# Patient Record
Sex: Male | Born: 1980 | Race: White | Hispanic: No | Marital: Married | Smoking: Never smoker
Health system: Southern US, Community
[De-identification: ages and names within clinical notes are randomized; demographics above are authoritative.]

## PROBLEM LIST (undated history)

## (undated) DIAGNOSIS — R7303 Prediabetes: Secondary | ICD-10-CM

## (undated) DIAGNOSIS — K219 Gastro-esophageal reflux disease without esophagitis: Secondary | ICD-10-CM

## (undated) DIAGNOSIS — G510 Bell's palsy: Secondary | ICD-10-CM

## (undated) DIAGNOSIS — J189 Pneumonia, unspecified organism: Secondary | ICD-10-CM

## (undated) DIAGNOSIS — F419 Anxiety disorder, unspecified: Secondary | ICD-10-CM

## (undated) DIAGNOSIS — L0231 Cutaneous abscess of buttock: Secondary | ICD-10-CM

## (undated) DIAGNOSIS — G43009 Migraine without aura, not intractable, without status migrainosus: Secondary | ICD-10-CM

## (undated) HISTORY — DX: Morbid (severe) obesity due to excess calories: E66.01

## (undated) HISTORY — PX: EYE SURGERY: SHX253

## (undated) HISTORY — DX: Gastro-esophageal reflux disease without esophagitis: K21.9

## (undated) HISTORY — DX: Migraine without aura, not intractable, without status migrainosus: G43.009

---

## 2003-01-29 HISTORY — PX: LASIK: SHX215

## 2008-10-29 ENCOUNTER — Emergency Department (HOSPITAL_COMMUNITY): Admission: EM | Admit: 2008-10-29 | Discharge: 2008-10-30 | Payer: Self-pay | Admitting: Emergency Medicine

## 2009-06-12 ENCOUNTER — Ambulatory Visit: Payer: Self-pay | Admitting: Internal Medicine

## 2009-06-12 LAB — CONVERTED CEMR LAB
Basophils Relative: 0.8 % (ref 0.0–3.0)
Chloride: 104 meq/L (ref 96–112)
Creatinine, Ser: 1 mg/dL (ref 0.4–1.5)
HDL: 38.8 mg/dL — ABNORMAL LOW (ref >39.00)
Hemoglobin: 15.3 g/dL (ref 13.0–17.0)
LDL Cholesterol: 116 mg/dL — ABNORMAL HIGH (ref 0–99)
Leukocytes, UA: NEGATIVE
Lymphocytes Relative: 36.3 % (ref 12.0–46.0)
MCHC: 34.5 g/dL (ref 30.0–36.0)
MCV: 88.6 fL (ref 78.0–100.0)
Neutrophils Relative %: 50.8 % (ref 43.0–77.0)
Nitrite: NEGATIVE
Platelets: 229 10*3/uL (ref 150.0–400.0)
Potassium: 4.7 meq/L (ref 3.5–5.1)
RBC: 5 M/uL (ref 4.22–5.81)
RDW: 13.3 % (ref 11.5–14.6)
Specific Gravity, Urine: >=1.03 (ref 1.000–1.030)
Total Bilirubin: 0.8 mg/dL (ref 0.3–1.2)
Total CHOL/HDL Ratio: 4
Total Protein, Urine: NEGATIVE mg/dL
Urine Glucose: NEGATIVE mg/dL
Urobilinogen, UA: 0.2 (ref 0.0–1.0)
WBC: 7.3 10*3/uL (ref 4.5–10.5)
pH: 5.5 (ref 5.0–8.0)

## 2009-06-16 ENCOUNTER — Ambulatory Visit: Payer: Self-pay | Admitting: Internal Medicine

## 2009-06-16 DIAGNOSIS — G43009 Migraine without aura, not intractable, without status migrainosus: Secondary | ICD-10-CM | POA: Insufficient documentation

## 2009-06-16 DIAGNOSIS — Z9189 Other specified personal risk factors, not elsewhere classified: Secondary | ICD-10-CM | POA: Insufficient documentation

## 2009-06-16 DIAGNOSIS — K219 Gastro-esophageal reflux disease without esophagitis: Secondary | ICD-10-CM | POA: Insufficient documentation

## 2009-11-28 ENCOUNTER — Ambulatory Visit: Payer: Self-pay | Admitting: Internal Medicine

## 2009-11-28 DIAGNOSIS — H9209 Otalgia, unspecified ear: Secondary | ICD-10-CM | POA: Insufficient documentation

## 2010-02-27 NOTE — Assessment & Plan Note (Signed)
Summary: EAR PROBLEM /NWS   Vital Signs:  Patient profile:   30 year old male Height:      71 inches (180.34 cm) Weight:      305 pounds (138.64 kg) O2 Sat:      97 % on Room air Temp:     98.2 degrees F (36.78 degrees C) oral Pulse rate:   95 / minute BP sitting:   122 / 70  (left arm) Cuff size:   large  Vitals Entered By: Orlan Leavens RMA (November 28, 2009 1:40 PM)  O2 Flow:  Room air CC: (R) ear problem ? clogged Is Patient Diabetic? No Pain Assessment Patient in pain? no        Primary Care Provider:  Newt Lukes MD  CC:  (R) ear problem ? clogged.  History of Present Illness: c/o right ear problems onset yesterday AM not a/w pain or drainage denies hearing charge or dizziness or balance problems but feels "full" no sinus pressure or drainage no hx same -  Current Medications (verified): 1)  Multivitamins  Tabs (Multiple Vitamin) .Marland Kitchen.. 1 By Mouth Once Daily 2)  Pepcid Ac Maximum Strength 20 Mg Tabs (Famotidine) .Marland Kitchen.. 1 By Mouth Once Daily As Needed For Reflux  Allergies (verified): No Known Drug Allergies  Past History:  Past Medical History: migraines, stress related GERD obesity    Review of Systems  The patient denies fever, decreased hearing, hoarseness, syncope, and headaches.    Physical Exam  General:  overweight-appearing.  alert, well-developed, well-nourished, and cooperative to examination.    Ears:  normal pinnae bilaterally, without erythema, swelling, or tenderness to palpation. TMs clear, without effusion, or cerumen impaction. Hearing grossly normal bilaterally  Mouth:  teeth and gums in good repair; mucous membranes moist, without lesions or ulcers. oropharynx clear without exudate, no erythema. no PND Lungs:  normal respiratory effort, no intercostal retractions or use of accessory muscles; normal breath sounds bilaterally - no crackles and no wheezes.    Heart:  normal rate, regular rhythm, no murmur, and no rub. BLE without  edema.    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   Complete Medication List: 1)  Multivitamins Tabs (Multiple vitamin) .Marland Kitchen.. 1 by mouth once daily 2)  Pepcid Ac Maximum Strength 20 Mg Tabs (Famotidine) .Marland Kitchen.. 1 by mouth once daily as needed for reflux 3)  Prednisone (pak) 10 Mg Tabs (Prednisone) .... As directed x 6 days 4)  Nasonex 50 Mcg/act Susp (Mometasone furoate) .Marland Kitchen.. 1 spray each nostril  every morning x 3 weeks, then as needed for sinus and ear symptoms 5)  Sudafed 30 Mg Tabs (Pseudoephedrine hcl) .Marland Kitchen.. 1-2 by mouth every 6 hours as needed for congestion  Patient Instructions: 1)  it was good to see you today. 2)  predcnisone pak and nasal steroids for eustatian tube dysfunction symptoms - your prescriptions have been given to you to submit to your pharmacy. Please take as directed. Contact our office if you believe you're having problems with the medication(s).  3)  also try Sudafed for decongestant to help ear symptoms (kept behind the counter at your pharmacy) 4)  if symptoms  unimporved after 6-7 days of treatment, call for referral to audiology for further testing -- call sooner if worse Prescriptions: NASONEX 50 MCG/ACT SUSP (MOMETASONE FUROATE) 1 spray each nostril  every morning x 3 weeks, then as needed for sinus and ear symptoms  #1 x 1   Entered and Authorized by:   Newt Lukes MD   Signed by:   Newt Lukes MD on 11/28/2009   Method used:   Print then Give to Patient   RxID:   [id number] PREDNISONE (PAK) 10 MG TABS (PREDNISONE) as directed x 6 days  #1 x 0   Entered and Authorized by:   Newt Lukes MD   Signed by:   Newt Lukes MD on  11/28/2009   Method used:   Print then Give to Patient   RxID:   [id number]    Orders Added: 1)  Est. Patient Level IV [84696]

## 2010-02-27 NOTE — Assessment & Plan Note (Signed)
Summary: NEW [employer] PT PHYSICAL  PKG  STC   Vital Signs:  Patient profile:   30 year old male Height:      71 inches (180.34 cm) Weight:      305.4 pounds (138.82 kg) BMI:     42.75 O2 Sat:      97 % on Room air Temp:     97.1 degrees F (36.17 degrees C) oral Pulse rate:   68 / minute BP sitting:   112 / 70  (left arm) Cuff size:   large  Vitals Entered By: Orlan Leavens (Jun 16, 2009 2:05 PM)  O2 Flow:  Room air CC: New patient CPX Is Patient Diabetic? No Pain Assessment Patient in pain? no        Primary Care Provider:  Newt Lukes MD  CC:  New patient CPX.  History of Present Illness: new pt to me and our practice - here to est care also patient is here today for annual physical. Patient feels well and has no complaints.   Preventive Screening-Counseling & Management  Alcohol-Tobacco     Alcohol drinks/day: <1     Alcohol Counseling: not indicated; use of alcohol is not excessive or problematic     Smoking Status: never     Tobacco Counseling: not indicated; no tobacco use  Caffeine-Diet-Exercise     Diet Counseling: to improve diet; diet is suboptimal     Does Patient Exercise: yes     Exercise Counseling: not indicated; exercise is adequate     Depression Counseling: not indicated; screening negative for depression  Safety-Violence-Falls     Seat Belt Use: yes     Seat Belt Counseling: not indicated; patient wears seat belts     Helmet Use: yes     Helmet Counseling: not indicated; patient wears helmet when riding bicycle/motocycle     Firearms in the Home: no firearms in the home     Firearm Counseling: not applicable     Smoke Detectors: yes     Smoke Detector Counseling: no     Violence Counseling: not indicated; no violence risk noted     Fall Risk Counseling: not indicated; no significant falls noted  Clinical Review Panels:  Immunizations   Last Tetanus Booster:  Historical (01/29/1996)  Lipid Management   Cholesterol:  171  (06/12/2009)   LDL (bad choesterol):  116 (06/12/2009)   HDL (good cholesterol):  38.80 (06/12/2009)  CBC   WBC:  7.3 (06/12/2009)   RBC:  5.00 (06/12/2009)   Hgb:  15.3 (06/12/2009)   Hct:  44.3 (06/12/2009)   Platelets:  229.0 (06/12/2009)   MCV  88.6 (06/12/2009)   MCHC  34.5 (06/12/2009)   RDW  13.3 (06/12/2009)   PMN:  50.8 (06/12/2009)   Lymphs:  36.3 (06/12/2009)   Monos:  9.1 (06/12/2009)   Eosinophils:  3.0 (06/12/2009)   Basophil:  0.8 (06/12/2009)  Complete Metabolic Panel   Glucose:  91 (06/12/2009)   Sodium:  141 (06/12/2009)   Potassium:  4.7 (06/12/2009)   Chloride:  104 (06/12/2009)   CO2:  27 (06/12/2009)   BUN:  14 (06/12/2009)   Creatinine:  1.0 (06/12/2009)   Albumin:  4.4 (06/12/2009)   Total Protein:  6.9 (06/12/2009)   Calcium:  9.8 (06/12/2009)   Total Bili:  0.8 (06/12/2009)   Alk Phos:  55 (06/12/2009)   SGPT (ALT):  29 (06/12/2009)   SGOT (AST):  25 (06/12/2009)   Current Medications (verified): 1)  None  Allergies (  verified): No Known Drug Allergies  Past History:  Past Medical History: migraines, stress related GERD obesity  Past Surgical History: Lasik (2005)  Family History: Family History of Alcoholism/Addiction (dad) Family History of Arthritis (grandparent) Family History Diabetes 1st degree relative (momt) Stroke (parent)  dad expired late 78sy - CVA, alcohol complications mom - 33s - DM, obese  Social History: Never Smoked occassional alcohol use married, 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 I have reviewed all other systems and they were negative.   Physical Exam  General:  overweight-appearing.  alert, well-developed, well-nourished, and cooperative to examination.    Eyes:  vision grossly intact; pupils equal, round and reactive to light.  conjunctiva and lids normal.    Ears:  normal  pinnae bilaterally, without erythema, swelling, or tenderness to palpation. TMs clear, without effusion, or cerumen impaction. Hearing grossly normal bilaterally  Mouth:  teeth and gums in good repair; mucous membranes moist, without lesions or ulcers. oropharynx clear without exudate, no erythema.  Neck:  supple, full ROM, no masses, no thyromegaly; no thyroid nodules or tenderness. no JVD or carotid bruits.   Lungs:  normal respiratory effort, no intercostal retractions or use of accessory muscles; normal breath sounds bilaterally - no crackles and no wheezes.    Heart:  normal rate, regular rhythm, no murmur, and no rub. BLE without edema.  Abdomen:  obese, soft, non-tender, normal bowel sounds, no distention; no masses and no appreciable hepatomegaly or splenomegaly.   Genitalia:  defer Prostate:  defer Msk:  No deformity or scoliosis noted of thoracic or lumbar spine.   Neurologic:  alert & oriented X3 and cranial nerves II-XII symetrically intact.  strength normal in all extremities, sensation intact to light touch, and gait normal. speech fluent without dysarthria or aphasia; follows commands with good comprehension.  Skin:  no rashes, vesicles, ulcers, or erythema. No nodules or irregularity to palpation.  Psych:  Oriented X3, memory intact for recent and remote, normally interactive, good eye contact, not anxious appearing, not depressed appearing, and not agitated.      Impression & Recommendations:  Problem # 1:  PREVENTIVE HEALTH CARE (ICD-V70.0) Patient has been counseled on age-appropriate routine health concerns for screening and prevention. These are reviewed and up-to-date. Immunizations are up-to-date or declined. Labs reviewed.   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 1 by mouth once daily 2)  Pepcid Ac Maximum Strength 20 Mg Tabs (Famotidine) .Marland Kitchen.. 1 by mouth once daily as needed for reflux  Patient Instructions: 1)  it was good to meet you today.  2)  labs reviewed - numbers look good 3)  it is important that you continue to work on losing weight - continue to monitor your diet and consume fewer calories such as less carbohydrates (sugar), less fat and more protien. you also need to maintain your physical activity level - cardio up to 30 minutes 5 times each week and some weight/core training.  4)  Please schedule a follow-up appointment annuallt for your medical physical and labs, call sooner if problems.  Immunization History:  Tetanus/Td Immunization History:    Tetanus/Td:  historical (01/29/1996)

## 2010-05-03 LAB — POCT CARDIAC MARKERS: Troponin i, poc: <0.05 ng/mL (ref 0.00–0.09)

## 2010-12-25 ENCOUNTER — Ambulatory Visit (INDEPENDENT_AMBULATORY_CARE_PROVIDER_SITE_OTHER): Payer: BC Managed Care – PPO | Admitting: Internal Medicine

## 2010-12-25 ENCOUNTER — Encounter: Payer: Self-pay | Admitting: Internal Medicine

## 2010-12-25 VITALS — BP 140/90 | HR 108 | Temp 98.8°F

## 2010-12-25 DIAGNOSIS — J069 Acute upper respiratory infection, unspecified: Secondary | ICD-10-CM

## 2010-12-25 DIAGNOSIS — J320 Chronic maxillary sinusitis: Secondary | ICD-10-CM

## 2010-12-25 MED ORDER — LEVOFLOXACIN 500 MG PO TABS: 500.0000 mg | ORAL_TABLET | Freq: Every day | ORAL | Status: AC

## 2010-12-25 NOTE — Patient Instructions (Signed)
It was good to see you today. Levaquin antibiotics once daily for the next 7 days to treat sinus infection symptoms - Your prescription(s) have been submitted to your pharmacy. Please take as directed and contact our office if you believe you are having problem(s) with the medication(s). Continue antihistamine and decongestants over-the-counter as ongoing along with Tylenol or Advil for aches and pain as needed Sinusitis Sinusitis an infection of the air pockets (sinuses) in your face. This can cause puffiness (swelling). It can also cause drainage from your sinuses.   HOME CARE    Only take medicine as told by your doctor.     Drink enough fluids to keep your pee (urine) clear or pale yellow.     Apply moist heat or ice packs for pain relief.     Use salt (saline) nose sprays. The spray will wet the thick fluid in the nose. This can help the sinuses drain.  GET HELP RIGHT AWAY IF:    You have a fever.     Your baby is older than 3 months with a rectal temperature of 102 F (38.9 C) or higher.     Your baby is 36 months old or younger with a rectal temperature of 100.4 F (38 C) or higher.     The pain gets worse.     You get a very bad headache.     You keep throwing up (vomiting).     Your face gets puffy.  MAKE SURE YOU:    Understand these instructions.     Will watch your condition.     Will get help right away if you are not doing well or get worse.  Document Released: 07/03/2007 Document Revised: 09/26/2010 Document Reviewed: 07/03/2007 [facility] Patient Information 2012 [place], [place].

## 2010-12-25 NOTE — Progress Notes (Signed)
  Subjective:    HPI  complains of cold symptoms >>progression into sinus pain Onset >1 week ago, wax/wane symptoms  associated with rhinorrhea, sneezing, sore throat, mild headache and low grade fever Also myalgias, sinus pressure and nasal discharge (green, blood tinged) and mild-mod head congestion No relief with OTC meds Precipitated by sick contacts  Past Medical History  Diagnosis Date  . [place], HX OF   . COMMON MIGRAINE   . GERD   . OBESITY, MORBID     Review of Systems Constitutional: No fever or night sweats, no unexpected weight change Pulmonary: No pleurisy or hemoptysis Cardiovascular: No chest pain or palpitations     Objective:   Physical Exam BP 140/90  Pulse 108  Temp(Src) 98.8 F (37.1 C) (Oral)  SpO2 97% GEN: mildly ill appearing and audible head/chest congestion HENT: NCAT, mild sinus tenderness bilaterally, nares with clear discharge, oropharynx mild erythema, no exudate Eyes: Vision grossly intact, no conjunctivitis Lungs: Clear to auscultation without rhonchi or wheeze, no increased work of breathing Cardiovascular: Regular rate and rhythm, no bilateral edema      Assessment & Plan:  Viral URI - improving Sinusitis, maxillary -    Empiric antibiotics prescribed due to symptom duration greater than 7 days and sinus discharge Symptomatic care with Tylenol or Advil, hydration and rest -  Also over-the-counter antihistamine and decongestant as needed

## 2011-01-17 ENCOUNTER — Ambulatory Visit (INDEPENDENT_AMBULATORY_CARE_PROVIDER_SITE_OTHER): Payer: BC Managed Care – PPO | Admitting: Internal Medicine

## 2011-01-17 ENCOUNTER — Encounter: Payer: Self-pay | Admitting: Internal Medicine

## 2011-01-17 VITALS — BP 140/78 | HR 82 | Temp 98.1°F | Resp 16 | Wt 334.0 lb

## 2011-01-17 DIAGNOSIS — L0231 Cutaneous abscess of buttock: Secondary | ICD-10-CM | POA: Insufficient documentation

## 2011-01-17 DIAGNOSIS — L03317 Cellulitis of buttock: Secondary | ICD-10-CM | POA: Insufficient documentation

## 2011-01-17 MED ORDER — SULFAMETHOXAZOLE-TRIMETHOPRIM 800-160 MG PO TABS: 1.0000 | ORAL_TABLET | Freq: Two times a day (BID) | ORAL | Status: AC

## 2011-01-17 NOTE — Assessment & Plan Note (Signed)
This looks like a small, early MRSA abscess that has already been released, to prevent re-occurence or further infection he will start taking bactrim-ds

## 2011-01-17 NOTE — Progress Notes (Signed)
  Subjective:    Patient ID: Stephen Freeman, male    DOB: 02-Dec-1980, 30 y.o.   MRN: [id number]  HPI New to me he complains of a pimple on his right buttocks that develop about one week and over the last few days it ruptured and it drained a think fluid, the area still feel a little sore and his wants it checked.   Review of Systems  Constitutional: Negative.   HENT: Negative.   Eyes: Negative.   Respiratory: Negative.   Cardiovascular: Negative.   Gastrointestinal: Negative.   Genitourinary: Negative.   Musculoskeletal: Negative.   Skin: Positive for wound. Negative for color change, pallor and rash.  Neurological: Negative.   Hematological: Negative.   Psychiatric/Behavioral: Negative.        Objective:   Physical Exam  Vitals reviewed. Constitutional: He is oriented to person, place, and time. He appears well-developed and well-nourished. No distress.  HENT:  Head: Normocephalic and atraumatic.  Eyes: Conjunctivae are normal. Left eye exhibits no discharge. No scleral icterus.  Neck: Normal range of motion. Neck supple. No JVD present. No tracheal deviation present. No thyromegaly present.  Cardiovascular: Normal rate and regular rhythm.   Pulmonary/Chest: Effort normal and breath sounds normal. No stridor.  Abdominal: Soft. Bowel sounds are normal. He exhibits no distension. There is no tenderness. There is no rebound and no guarding.  Genitourinary: Rectal exam shows external hemorrhoid. Rectal exam shows no fissure, no mass and no tenderness.     Musculoskeletal: Normal range of motion. He exhibits no edema and no tenderness.  Lymphadenopathy:    He has no cervical adenopathy.  Neurological: He is oriented to person, place, and time.  Skin: Skin is warm and dry. No rash noted. He is not diaphoretic. No erythema. No pallor.  Psychiatric: He has a normal mood and affect. His behavior is normal. Judgment and thought content normal.          Assessment & Plan:

## 2011-01-17 NOTE — Patient Instructions (Signed)

## 2011-02-01 ENCOUNTER — Ambulatory Visit: Payer: BC Managed Care – PPO | Admitting: Internal Medicine

## 2011-02-14 ENCOUNTER — Ambulatory Visit (INDEPENDENT_AMBULATORY_CARE_PROVIDER_SITE_OTHER): Payer: BC Managed Care – PPO | Admitting: Internal Medicine

## 2011-02-14 ENCOUNTER — Encounter: Payer: Self-pay | Admitting: Internal Medicine

## 2011-02-14 VITALS — BP 120/72 | HR 87 | Temp 98.6°F | Wt 331.4 lb

## 2011-02-14 DIAGNOSIS — Z23 Encounter for immunization: Secondary | ICD-10-CM

## 2011-02-14 DIAGNOSIS — Z0000 Encounter for general adult medical examination without abnormal findings: Secondary | ICD-10-CM

## 2011-02-14 NOTE — Patient Instructions (Signed)
It was good to see you today. Health Maintenance reviewed - everything is up to date: Tdap done today - get flu at any pharmacy location.  Test(s) ordered today. Your results will be called to you after review (48-72hours after test completion). If any changes need to be made, you will be notified at that time. Work on lifestyle changes as discussed (low fat, low carb, increased protein diet; improved exercise efforts; weight loss) to control sugar, blood pressure and cholesterol levels and/or reduce risk of developing other medical problems. Look into [email or web address] or other type of food journal to assist you in this process. Please schedule followup in 6-12 months for weight check, call sooner if problems.

## 2011-02-14 NOTE — Assessment & Plan Note (Signed)
Wt Readings from Last 3 Encounters:  02/14/11 331 lb 6.4 oz (150.322 kg)  01/17/11 334 lb (151.501 kg)  11/28/09 305 lb (138.347 kg)   Reviewed importance of weight loss and diet/exercise to health, esp with FH mom DM Discussed appropriate diet/exercise and rate of loss expectations

## 2011-02-14 NOTE — Progress Notes (Signed)
  Subjective:    Patient ID: Stephen Freeman, male    DOB: Apr 02, 1980, 31 y.o.   MRN: [id number]  HPI patient is here today for annual physical. Patient feels well and has no complaints.  Past Medical History  Diagnosis Date  . COMMON MIGRAINE   . GERD   . OBESITY, MORBID    Family History  Problem Relation Age of Onset  . Diabetes Mother   . Alcohol abuse Father   . Arthritis Other     grandparent   History  Substance Use Topics  . Smoking status: Never Smoker   . Smokeless tobacco: Not on file  . Alcohol Use: Yes    Review of Systems Constitutional: Negative for fever or weight change.  Respiratory: Negative for cough and shortness of breath.   Cardiovascular: Negative for chest pain or palpitations.  Gastrointestinal: Negative for abdominal pain, no bowel changes.  Musculoskeletal: Negative for gait problem or joint swelling.  Skin: Negative for rash.  Neurological: Negative for dizziness or headache.  No other specific complaints in a complete review of systems (except as listed in HPI above).     Objective:   Physical Exam BP 120/72  Pulse 87  Temp(Src) 98.6 F (37 C) (Oral)  Wt 331 lb 6.4 oz (150.322 kg)  SpO2 98% Wt Readings from Last 3 Encounters:  02/14/11 331 lb 6.4 oz (150.322 kg)  01/17/11 334 lb (151.501 kg)  11/28/09 305 lb (138.347 kg)   Constitutional:  He is morbidly obese, but appears well-developed and well-nourished. No distress. HENT: NCAT, no sinus tenderness, TMs clear, OP clear without exudate  Eyes: twitch of lower lid L eye - PERRL, EOMI - no icterus or conjunctivitis - Vision grossly intact (uncorrected) Neck: Normal range of motion. Neck supple. No JVD present. No thyromegaly present.  Cardiovascular: Normal rate, regular rhythm and normal heart sounds.  No murmur heard. no BLE edema Pulmonary/Chest: Effort normal and breath sounds normal. No respiratory distress. no wheezes.  Abdominal: Obese, Soft. Bowel sounds are normal. Patient  exhibits no distension. There is no tenderness.  Musculoskeletal: Normal range of motion. Patient exhibits no edema.  Neurological: he is alert and oriented to person, place, and time. No cranial nerve deficit. Coordination normal.  Skin: Skin is warm and dry.  No erythema or ulceration.  Psychiatric: he has a normal mood and affect. behavior is normal. Judgment and thought content normal.    Lab Results  Component Value Date   WBC 7.3 06/12/2009   HGB 15.3 06/12/2009   HCT 44.3 06/12/2009   PLT 229.0 06/12/2009   GLUCOSE 91 06/12/2009   CHOL 171 06/12/2009   TRIG 83.0 06/12/2009   HDL 38.80* 06/12/2009   LDLCALC 116* 06/12/2009   ALT 29 06/12/2009   AST 25 06/12/2009   NA 141 06/12/2009   K 4.7 06/12/2009   CL 104 06/12/2009   CREATININE 1.0 06/12/2009   BUN 14 06/12/2009   CO2 27 06/12/2009   TSH 1.78 06/12/2009        Assessment & Plan:  CPX -v70.0 - Patient has been counseled on age-appropriate routine health concerns for screening and prevention. These are reviewed and up-to-date. Immunizations are up-to-date or declined. Labs ordered and will be reviewed.

## 2011-03-12 ENCOUNTER — Other Ambulatory Visit (INDEPENDENT_AMBULATORY_CARE_PROVIDER_SITE_OTHER): Payer: BC Managed Care – PPO

## 2011-03-12 DIAGNOSIS — Z0000 Encounter for general adult medical examination without abnormal findings: Secondary | ICD-10-CM

## 2011-03-12 LAB — URINALYSIS
Hgb urine dipstick: NEGATIVE
Ketones, ur: NEGATIVE
Leukocytes, UA: NEGATIVE
Specific Gravity, Urine: 1.02 (ref 1.000–1.030)
Total Protein, Urine: NEGATIVE
Urine Glucose: NEGATIVE
pH: 6 (ref 5.0–8.0)

## 2011-03-12 LAB — BASIC METABOLIC PANEL
Chloride: 105 mEq/L (ref 96–112)
GFR: 103.41 mL/min (ref >60.00)
Potassium: 4.3 mEq/L (ref 3.5–5.1)

## 2011-03-12 LAB — CBC WITH DIFFERENTIAL/PLATELET
Basophils Absolute: 0.1 10*3/uL (ref 0.0–0.1)
Basophils Relative: 0.8 % (ref 0.0–3.0)
Eosinophils Absolute: 0.2 10*3/uL (ref 0.0–0.7)
Hemoglobin: 13.6 g/dL (ref 13.0–17.0)
MCHC: 33 g/dL (ref 30.0–36.0)
RDW: 14 % (ref 11.5–14.6)

## 2011-03-12 LAB — HEPATIC FUNCTION PANEL
Alkaline Phosphatase: 60 U/L (ref 39–117)
Bilirubin, Direct: 0.1 mg/dL (ref 0.0–0.3)
Total Protein: 7.4 g/dL (ref 6.0–8.3)

## 2011-03-12 LAB — LIPID PANEL
HDL: 42.3 mg/dL (ref >39.00)
VLDL: 19.8 mg/dL (ref 0.0–40.0)

## 2011-03-12 LAB — TSH: TSH: 2.24 u[IU]/mL (ref 0.35–5.50)

## 2011-07-30 ENCOUNTER — Encounter: Payer: Self-pay | Admitting: Internal Medicine

## 2011-07-30 ENCOUNTER — Ambulatory Visit (INDEPENDENT_AMBULATORY_CARE_PROVIDER_SITE_OTHER): Payer: BC Managed Care – PPO | Admitting: Internal Medicine

## 2011-07-30 ENCOUNTER — Other Ambulatory Visit (INDEPENDENT_AMBULATORY_CARE_PROVIDER_SITE_OTHER): Payer: BC Managed Care – PPO

## 2011-07-30 VITALS — BP 112/78 | HR 85 | Temp 98.6°F | Ht 71.0 in | Wt 339.0 lb

## 2011-07-30 DIAGNOSIS — N410 Acute prostatitis: Secondary | ICD-10-CM

## 2011-07-30 DIAGNOSIS — R300 Dysuria: Secondary | ICD-10-CM

## 2011-07-30 DIAGNOSIS — R309 Painful micturition, unspecified: Secondary | ICD-10-CM

## 2011-07-30 LAB — URINALYSIS, ROUTINE W REFLEX MICROSCOPIC
Bilirubin Urine: NEGATIVE
Leukocytes, UA: NEGATIVE
Specific Gravity, Urine: 1.015 (ref 1.000–1.030)
Urine Glucose: NEGATIVE
Urobilinogen, UA: 0.2 (ref 0.0–1.0)
pH: 7 (ref 5.0–8.0)

## 2011-07-30 LAB — POCT URINALYSIS DIPSTICK
Bilirubin, UA: NEGATIVE
Ketones, UA: NEGATIVE
Leukocytes, UA: NEGATIVE
Nitrite, UA: NEGATIVE
Protein, UA: NEGATIVE
Spec Grav, UA: <=1.005
pH, UA: 5

## 2011-07-30 MED ORDER — CIPROFLOXACIN HCL 500 MG PO TABS: 500.0000 mg | ORAL_TABLET | Freq: Two times a day (BID) | ORAL | Status: AC

## 2011-07-30 NOTE — Progress Notes (Signed)
  Subjective:    Patient ID: Stephen Freeman, male    DOB: 1980/05/29, 31 y.o.   MRN: [id number]  Dysuria  This is a new problem. The current episode started in the past 7 days. The problem occurs every urination. The problem has been unchanged. The quality of the pain is described as aching. The pain is moderate. There has been no fever. He is sexually active. There is no history of pyelonephritis. Associated symptoms include frequency and hematuria. Pertinent negatives include no discharge or sweats. He has tried nothing for the symptoms. The treatment provided no relief. There is no history of catheterization, kidney stones, recurrent UTIs or a urological procedure.    Past Medical History  Diagnosis Date  . COMMON MIGRAINE   . GERD   . OBESITY, MORBID      Review of Systems  Constitutional: Negative for fever and fatigue.  Cardiovascular: Negative for chest pain and palpitations.  Gastrointestinal: Negative for diarrhea, constipation and blood in stool.  Genitourinary: Positive for dysuria, frequency and hematuria. Negative for discharge, penile swelling, scrotal swelling, penile pain and testicular pain.       Objective:   Physical Exam BP 112/78  Pulse 85  Temp 98.6 F (37 C) (Oral)  Ht 5\' 11"  (1.803 m)  Wt 339 lb (153.769 kg)  BMI 47.28 kg/m2  SpO2 96% Constitutional:  He is obese, but appears well-developed and well-nourished. No distress.  Cardiovascular: Normal rate, regular rhythm and normal heart sounds.  No murmur heard. no BLE edema Pulmonary/Chest: Effort normal and breath sounds normal. No respiratory distress. no wheezes.  Abdominal: Soft. Bowel sounds are normal. Patient exhibits no distension. There is no tenderness.  GU: normal bilaterally descended testicles, no inguinal hernia - circumcised penis without lesion or discharge - Prostate mildly tender and boggy - no stool in vault, no hemorrhoids or mass Psychiatric: he has a normal mood and affect. behavior is  normal. Judgment and thought content normal.   Lab Results  Component Value Date   WBC 7.7 03/12/2011   HGB 13.6 03/12/2011   HCT 41.2 03/12/2011   PLT 251.0 03/12/2011   GLUCOSE 95 03/12/2011   CHOL 176 03/12/2011   TRIG 99.0 03/12/2011   HDL 42.30 03/12/2011   LDLCALC 114* 03/12/2011   ALT 38 03/12/2011   AST 22 03/12/2011   NA 140 03/12/2011   K 4.3 03/12/2011   CL 105 03/12/2011   CREATININE 0.9 03/12/2011   BUN 14 03/12/2011   CO2 27 03/12/2011   TSH 2.24 03/12/2011        Assessment & Plan:  Acute prostatitis - check UA/UCX Empiric cipro x 2 weeks - call if worse or unimproved

## 2011-07-30 NOTE — Patient Instructions (Signed)
It was good to see you today. Test(s) ordered today. Your results will be called to you after review (48-72hours after test completion). If any changes need to be made, you will be notified at that time. Cipro antibiotics 2x/day x 2 weeks - Your prescription(s) have been submitted to your pharmacy. Please take as directed and contact our office if you believe you are having problem(s) with the medication(s). Prostatitis Prostatitis is an inflammation (the body's way of reacting to injury and/or infection) of the prostate gland. The prostate gland is a male organ. The gland is about the size and shape of a walnut. The prostate is located just below the bladder. It produces semen, which is a fluid that helps nourish and transport sperm. Prostatitis is the most common urinary tract problem in men younger than age 58. There are 4 categories of prostatitis:  I - Acute bacterial prostatitis.   II - Chronic bacterial prostatitis.   III - Chronic prostatitis and chronic pelvic pain syndrome (CPPS).   Inflammatory.   Non inflammatory.   IV - Asymptomatic inflammatory prostatitis.  Acute and chronic bacterial prostatitis are problems with bacterial infections of the prostate. "Acute" infection is usually a one-time problem. "Chronic" bacterial prostatitis is a condition with recurrent infection. It is usually caused by the same germ(bacteria). CPPS has symptoms similar to prostate infection. However, no infection is actually found. This condition can cause problems of ongoing pain. Currently, it cannot be cured. Treatments are available and aimed at symptom control.   Asymptomatic inflammatory prostatitis has no symptoms. It is a condition where infection-fighting cells are found by chance in the urine. The diagnosis is made most often during an exam for other conditions. Other conditions could be infertility or a high level of PSA (prostate-specific antigen) in the blood. SYMPTOMS   Symptoms can vary  depending upon the type of prostatitis that exists. There can also be overlap in symptoms. This can make diagnosis difficult. Symptoms: For Acute bacterial prostatitis  Painful urination.   Fever or chills.   Muscle or joint pains.   Low back pain.   Low abdominal pain.   Inability to empty bladder completely.   Sudden urges to urinate.   Frequent urination during the day.   Difficulty starting urine stream.   Need to urinate several times at night (nocturia).   Weak urine stream.   Urethral (tube that carries urine from the bladder out of the body) discharge and dribbling after urination.  For Chronic bacterial prostatitis  Rectal pain.   Pain in the testicles, penis, or tip of the penis.   Pain in the space between the anus and scrotum (perineum).   Low back pain.   Low abdominal pain.   Problems with sexual function.   Painful ejaculation.   Bloody semen.   Inability to empty bladder completely.   Painful urination.   Sudden urges to urinate.   Frequent urination during the day.   Difficulty starting urine stream.   Need to urinate several times at night (nocturia).   Weak urine stream.   Dribbling after urination.   Urethral discharge.  For Chronic prostatitis and chronic pelvic pain syndrome (CPPS) Symptoms are the same as those for chronic bacterial prostatitis. Problems with sexual function are often the reason for seeking care. This important problem should be discussed with your caregiver. For Asymptomatic inflammatory prostatitis As noted above, there are no symptoms with this condition. DIAGNOSIS    Your caregiver may perform a rectal exam. This  exam is to determine if the prostate is swollen and tender.   Sometimes blood work is performed. This is done to see if your white blood cell count is elevated. The Prostate Specific Antigen (PSA) is also measured. PSA is a blood test that can help detect early prostate cancer.   A urinalysis  is done to find out what type of infection is present if this is a suspected cause. An additional urinalysis may be done after a digital rectal exam. This is to see if white blood cells are pushed out of the prostate and into the urine. A low-grade infection of the prostate may not be found on the first urinalysis.  In more difficult cases, your caregiver may advise other tests. Tests could include:  Urodynamics -- Tests the function of the bladder and the organs involved in triggering and controlling normal urination.   Urine flow rate.   Cystoscopy -- In this procedure, a thin, telescope-like tube with a light and tiny camera attached (cystoscope) is inserted into the bladder through the urethra. This allows the caregiver to see the inside of the urethra and bladder.   Electromyography -- This procedure tests how the muscles and nerves of the bladder work. It is focused on the muscles that control the anus and pelvic floor. These are the muscles between the anus and scrotum.  In people who show no signs of infection, certain uncommon infections might be causing constant or recurrent symptoms. These uncommon infections are difficult to detect. More work in medicine may help find solutions to these problems. TREATMENT   Antibiotics are used to treat infections caused by germs. If the infection is not treated and becomes long lasting (chronic), it may become a lower grade infection with minor, continual problems. Without treatment, the prostate may develop a boil or furuncle (abscess). This may require surgical treatment. For those with chronic prostatitis and CPPS, it is important to work closely with your primary caregiver and urologist. For some, the medicines that are used to treat a non-cancerous, enlarged prostate (benign prostatic hypertrophy) may be helpful. Referrals to specialists other than urologists may be necessary. In rare cases when all treatments have been inadequate for pain control, an  operation to remove the prostate may be recommended. This is very rare and before this is considered thorough discussion with your urologist is highly recommended.   In cases of secondary to chronic non-bacterial prostatitis, a good relationship with your urologist or primary caregiver is essential because it is often a recurrent prolonged condition that requires a good understanding of the causes and a commitment to therapy aimed at controlling your symptoms. HOME CARE INSTRUCTIONS    Hot sitz baths for 20 minutes, 4 times per day, may help relieve pain.   Non-prescription pain killers may be used as your caregiver recommends if you have no allergies to them. Some illnesses or conditions prevent use of non-prescription drugs. If unsure, check with your caregiver. Take all medications as directed. Take the antibiotics for the prescribed length of time, even if you are feeling better.  SEEK MEDICAL CARE IF:    You have any worsening of the symptoms that originally brought you to your caregiver.   You have an oral temperature above 102 F (38.9 C).   You experience any side effects from medications prescribed.  SEEK IMMEDIATE MEDICAL CARE IF:    You have an oral temperature above 102 F (38.9 C), not controlled by medicine.   You have pain not relieved  with medications.   You develop nausea, vomiting, lightheadedness, or have a fainting episode.   You are unable to urinate.   You pass bloody urine or clots.  Document Released: 01/12/2000 Document Revised: 01/03/2011 Document Reviewed: 12/17/2010 [facility] Patient Information 2012 [place], [place].

## 2011-08-01 LAB — URINE CULTURE: Colony Count: NO GROWTH

## 2011-09-12 ENCOUNTER — Ambulatory Visit (INDEPENDENT_AMBULATORY_CARE_PROVIDER_SITE_OTHER): Payer: BC Managed Care – PPO | Admitting: Internal Medicine

## 2011-09-12 ENCOUNTER — Encounter: Payer: Self-pay | Admitting: Internal Medicine

## 2011-09-12 VITALS — BP 122/82 | HR 90 | Temp 98.0°F

## 2011-09-12 DIAGNOSIS — J209 Acute bronchitis, unspecified: Secondary | ICD-10-CM

## 2011-09-12 MED ORDER — AZITHROMYCIN 250 MG PO TABS: ORAL_TABLET | ORAL | Status: DC

## 2011-09-12 MED ORDER — HYDROCODONE-HOMATROPINE 5-1.5 MG/5ML PO SYRP: 5.0000 mL | ORAL_SOLUTION | Freq: Four times a day (QID) | ORAL | Status: DC | PRN

## 2011-09-12 NOTE — Progress Notes (Signed)
  Subjective:   HPI  complains of cold symptoms  Onset >1 week ago, progressively worse symptoms  associated with sore throat, mild headache and low grade fever Also myalgias, sinus pressure and mild-mod chest congestion No relief with OTC meds Precipitated by sick contacts- dtr in daycare  Past Medical History  Diagnosis Date  . COMMON MIGRAINE   . GERD   . OBESITY, MORBID     Review of Systems Constitutional: No fever, no unexpected weight change Pulmonary: No pleurisy or hemoptysis Cardiovascular: No chest pain or palpitations     Objective:   Physical Exam BP 122/82  Pulse 90  Temp 98 F (36.7 C) (Oral)  SpO2 97% GEN: obese, mildly ill appearing and audible head/chest congestion HENT: NCAT, mild max sinus tenderness bilaterally, nares with clear discharge, oropharynx mild erythema, no exudate Eyes: Vision grossly intact, no conjunctivitis Lungs: Few rhonchi - no wheeze, no increased work of breathing but coughing effort with inspiration Cardiovascular: Regular rate and rhythm, no bilateral edema      Assessment & Plan:  acute bronchitis Cough, postnasal drip related to above   Empiric antibiotics prescribed due to symptom duration greater than 7 days Prescription cough suppression - new prescriptions done Symptomatic care with Tylenol or Advil, hydration and rest -  salt gargle advised as needed

## 2011-09-12 NOTE — Patient Instructions (Signed)
It was good to see you today. Zpak and prescription cough syrup - Your prescription(s) have been submitted to your pharmacy. Please take as directed and contact our office if you believe you are having problem(s) with the medication(s). Alternate between ibuprofen or Excedrin and tylenol for aches, pain and fever symptoms as discussed Hydrate, rest and call if worse or unimproved   Acute Bronchitis You have acute bronchitis. This means you have a chest cold. The airways in your lungs are red and sore (inflamed). Acute means it is sudden onset.   CAUSES Bronchitis is most often caused by the same virus that causes a cold. SYMPTOMS    Body aches.   Chest congestion.   Chills.   Cough.   Fever.   Shortness of breath.   Sore throat.  TREATMENT   Acute bronchitis is usually treated with rest, fluids, and medicines for relief of fever or cough. Most symptoms should go away after a few days or a week. Increased fluids may help thin your secretions and will prevent dehydration. Your caregiver may give you an inhaler to improve your symptoms. The inhaler reduces shortness of breath and helps control cough. You can take over-the-counter pain relievers or cough medicine to decrease coughing, pain, or fever. A cool-air vaporizer may help thin bronchial secretions and make it easier to clear your chest. Antibiotics are usually not needed but can be prescribed if you smoke, are seriously ill, have chronic lung problems, are elderly, or you are at higher risk for developing complications. Allergies and asthma can make bronchitis worse. Repeated episodes of bronchitis may cause longstanding lung problems. Avoid smoking and secondhand smoke. Exposure to cigarette smoke or irritating chemicals will make bronchitis worse. If you are a cigarette smoker, consider using nicotine gum or skin patches to help control withdrawal symptoms. Quitting smoking will help your lungs heal faster. Recovery from bronchitis  is often slow, but you should start feeling better after 2 to 3 days. Cough from bronchitis frequently lasts for 3 to 4 weeks. To prevent another bout of acute bronchitis:  Quit smoking.   Wash your hands frequently to get rid of viruses or use a hand sanitizer.   Avoid other people with cold or virus symptoms.   Try not to touch your hands to your mouth, nose, or eyes.  SEEK IMMEDIATE MEDICAL CARE IF:  You develop increased fever, chills, or chest pain.   You have severe shortness of breath or bloody sputum.   You develop dehydration, fainting, repeated vomiting, or a severe headache.   You have no improvement after 1 week of treatment or you get worse.  MAKE SURE YOU:    Understand these instructions.   Will watch your condition.   Will get help right away if you are not doing well or get worse.  Document Released: 02/22/2004 Document Revised: 01/03/2011 Document Reviewed: 05/09/2010 [facility] Patient Information 2012 [place], [place].

## 2011-09-13 ENCOUNTER — Encounter: Payer: Self-pay | Admitting: Internal Medicine

## 2011-09-13 ENCOUNTER — Ambulatory Visit (INDEPENDENT_AMBULATORY_CARE_PROVIDER_SITE_OTHER): Payer: BC Managed Care – PPO | Admitting: Internal Medicine

## 2011-09-13 VITALS — BP 102/80 | HR 80 | Temp 97.5°F

## 2011-09-13 DIAGNOSIS — H1030 Unspecified acute conjunctivitis, unspecified eye: Secondary | ICD-10-CM

## 2011-09-13 DIAGNOSIS — H1032 Unspecified acute conjunctivitis, left eye: Secondary | ICD-10-CM

## 2011-09-13 MED ORDER — POLYMYXIN B-TRIMETHOPRIM 10000-0.1 UNIT/ML-% OP SOLN: 1.0000 [drp] | OPHTHALMIC | Status: AC

## 2011-09-13 NOTE — Patient Instructions (Signed)
It was good to see you today. Polytrim eye drops - Your prescription(s) have been submitted to your pharmacy. Please take as directed and contact our office if you believe you are having problem(s) with the medication(s). Conjunctivitis Conjunctivitis is commonly called "pink eye." Conjunctivitis can be caused by bacterial or viral infection, allergies, or injuries. There is usually redness of the lining of the eye, itching, discomfort, and sometimes discharge. There may be deposits of matter along the eyelids. A viral infection usually causes a watery discharge, while a bacterial infection causes a yellowish, thick discharge. Pink eye is very contagious and spreads by direct contact. You may be given antibiotic eyedrops as part of your treatment. Before using your eye medicine, remove all drainage from the eye by washing gently with warm water and cotton balls. Continue to use the medication until you have awakened 2 mornings in a row without discharge from the eye. Do not rub your eye. This increases the irritation and helps spread infection. Use separate towels from other household members. Wash your hands with soap and water before and after touching your eyes. Use cold compresses to reduce pain and sunglasses to relieve irritation from light. Do not wear contact lenses or wear eye makeup until the infection is gone. SEEK MEDICAL CARE IF:    Your symptoms are not better after 3 days of treatment.   You have increased pain or trouble seeing.   The outer eyelids become very red or swollen.  Document Released: 02/22/2004 Document Revised: 01/03/2011 Document Reviewed: 01/14/2005 [employer] Patient Information 2012 [employer]. 

## 2011-09-13 NOTE — Progress Notes (Signed)
  Subjective:    Patient ID: Pasco Marchitto, male    DOB: Jun 26, 1980, 31 y.o.   MRN: [id number]  HPI  complains of pink eye - L eye Onset overnight (not present yesterday OV)  Review of Systems  Constitutional: Negative for fever and fatigue.  Eyes: Positive for discharge and redness. Negative for pain and visual disturbance.       Objective:   Physical Exam BP 102/80  Pulse 80  Temp 97.5 F (36.4 C) (Oral)  SpO2 97% Gen: NAD Eyes: B vision intact - L conjunctivitis - PERRL EOMI Lungs: scattered rhonchi, improved from yeterday      Assessment & Plan:  L conjunctivitis - empiric eye drops Education provided

## 2011-10-15 ENCOUNTER — Ambulatory Visit (INDEPENDENT_AMBULATORY_CARE_PROVIDER_SITE_OTHER): Payer: BC Managed Care – PPO | Admitting: Physician Assistant

## 2011-10-15 VITALS — BP 122/84 | HR 97 | Temp 99.8°F | Resp 18 | Ht 70.0 in | Wt 335.0 lb

## 2011-10-15 DIAGNOSIS — J029 Acute pharyngitis, unspecified: Secondary | ICD-10-CM

## 2011-10-15 LAB — POCT RAPID STREP A (OFFICE): Rapid Strep A Screen: NEGATIVE

## 2011-10-15 MED ORDER — AMOXICILLIN 875 MG PO TABS: 875.0000 mg | ORAL_TABLET | Freq: Two times a day (BID) | ORAL | Status: DC

## 2011-10-15 NOTE — Progress Notes (Signed)
  Subjective:    Patient ID: Stephen Freeman, male    DOB: [birth year], 31 y.o.   MRN: [id number]  HPI 31 year old male presents with acute onset of sore throat and fever. Does admit that there was a strep outbreak at his daughter's daycare, his daughter however has not had strep.  States he had a fever of 102 last night and has been taking only Motrin prn.  Denies cough, congestion, headache, nausea, vomiting, or abdominal pain.     Review of Systems  All other systems reviewed and are negative.       Objective:   Physical Exam  Constitutional: He is oriented to person, place, and time. He appears well-developed and well-nourished.  HENT:  Head: Normocephalic and atraumatic.  Right Ear: External ear normal.  Left Ear: External ear normal.  Mouth/Throat: Oropharyngeal exudate (bilateral tonsillar erythema and swelling) present.  Eyes: Conjunctivae normal are normal.  Neck: Normal range of motion.  Cardiovascular: Normal rate, regular rhythm and normal heart sounds.   Pulmonary/Chest: Effort normal and breath sounds normal.  Lymphadenopathy:    He has cervical adenopathy (AC).  Neurological: He is alert and oriented to person, place, and time.  Psychiatric: He has a normal mood and affect. His behavior is normal. Judgment and thought content normal.    Results for orders placed in visit on 10/15/11  POCT RAPID STREP A (OFFICE)      Component Value Range   Rapid Strep A Screen Negative  Negative         Assessment & Plan:   1. Acute pharyngitis  POCT rapid strep A, Culture, Group A Strep, amoxicillin (AMOXIL) 875 MG tablet   Throat culture sent. Will go ahead and treat for strep based on symptoms and positive exposure.  Continue motrin or tylenol as needed for fever and throat pain.

## 2011-10-17 LAB — CULTURE, GROUP A STREP: Organism ID, Bacteria: NORMAL

## 2011-10-31 ENCOUNTER — Other Ambulatory Visit: Payer: Self-pay | Admitting: Internal Medicine

## 2011-10-31 ENCOUNTER — Encounter: Payer: Self-pay | Admitting: Internal Medicine

## 2011-10-31 ENCOUNTER — Ambulatory Visit (INDEPENDENT_AMBULATORY_CARE_PROVIDER_SITE_OTHER): Payer: BC Managed Care – PPO | Admitting: Internal Medicine

## 2011-10-31 VITALS — BP 122/68 | HR 84 | Temp 97.7°F | Ht 71.0 in | Wt 337.0 lb

## 2011-10-31 DIAGNOSIS — N50812 Left testicular pain: Secondary | ICD-10-CM

## 2011-10-31 DIAGNOSIS — R9389 Abnormal findings on diagnostic imaging of other specified body structures: Secondary | ICD-10-CM

## 2011-10-31 DIAGNOSIS — N452 Orchitis: Secondary | ICD-10-CM

## 2011-10-31 DIAGNOSIS — N509 Disorder of male genital organs, unspecified: Secondary | ICD-10-CM

## 2011-10-31 DIAGNOSIS — N453 Epididymo-orchitis: Secondary | ICD-10-CM

## 2011-10-31 NOTE — Progress Notes (Addendum)
Subjective:    Patient ID: Stephen Freeman, male    DOB: 25-Jun-1980, 31 y.o.   MRN: [id number]  HPI  Complains of left testicle pain Onset 2 days ago, abrupt discomfort while at rest  Pain is associated with swelling Pain has gradually improved since onset, but not resolved Patient denies trauma, infection, fever or penile discharge Patient denies history of same symptoms  Past Medical History  Diagnosis Date  . COMMON MIGRAINE   . GERD   . OBESITY, MORBID    Review of Systems  Genitourinary: Positive for scrotal swelling and testicular pain. Negative for dysuria, urgency, frequency, hematuria, decreased urine volume, discharge, difficulty urinating, genital sores and penile pain.  Musculoskeletal: Negative for back pain and joint swelling.  Skin: Negative for color change, pallor and rash.       Objective:   Physical Exam BP 122/68  Pulse 84  Temp 97.7 F (36.5 C) (Oral)  Ht 5\' 11"  (1.803 m)  Wt 337 lb (152.862 kg)  BMI 47.00 kg/m2  SpO2 97% Wt Readings from Last 3 Encounters:  10/31/11 337 lb (152.862 kg)  10/15/11 335 lb (151.955 kg)  07/30/11 339 lb (153.769 kg)   General: Patient is morbidly obese, no acute distress Abdomen: Obese, soft nontender nondistended, no inguinal hernias GU: Supervised by Dava Najjar, CMA: Bilaterally descended testicles with slight swelling of the left side, tender testicle to palpation anteriorly on the left but no mass appreciated. Uncircumcised, penile lesions, discharge or rash       Assessment & Plan:   L testicle pain and swelling  -  check ultrasound and doppler r/o torsion or other abnormality Hold empiric antibiotics as no clear evidence for infection or epididymitis at present Symptomatic treatment with ibuprofen and Tylenol given improvement pain symptoms since onset pending further evaluation  Addendum US Scrotum  11/01/2011  *RADIOLOGY REPORT*  Clinical Data:  Left testicular pain with swelling for several days  SCROTAL  ULTRASOUND DOPPLER ULTRASOUND OF THE TESTICLES  Technique: Complete ultrasound examination of the testicles, epididymis, and other scrotal structures was performed.  Color and spectral Doppler ultrasound were also utilized to evaluate blood flow to the testicles.  Comparison:  None  Findings:  Right testis:  The right testicle is normal in size with normal echogenicity.  Blood flow is demonstrated to the right testicle with arterial and venous waveforms.  There is a small area of diminished echogenicity inferiorly in the right testicle of questionable significance measuring approximately 5 x 6 mm.  Follow- up ultrasound in 03-4 months may be helpful to assess further.  Left testis:  The left testicle is normal in size and there is blood flow demonstrated with arterial and venous waveforms. However there is more vascularity within the left testicle and orchitis would be a consideration.  Right epididymis:  Right epididymis is unremarkable.  Left epididymis:  A small left epididymal cyst is present.  Hydrocele:  A small amount of fluid is noted in the right scrotum.  Varicocele:  No varicocele is seen.  Pulsed Doppler interrogation of both testes demonstrates low resistance flow bilaterally.  IMPRESSION:  1.  There is blood flow to both testicles. 2.  Blood flow to the left testicle is increased which could indicate orchitis.  Correlate clinically. 3.  Small amount of fluid in the right scrotum. 4.  Subtle inhomogeneous hypoechoic area inferiorly in the right testicle, of uncertain significance.  Recommend follow-up ultrasound in 3-4 months.   Original Report Authenticated By: Juline Patch, M.D.  [place] Art/ven Flow Abd Pelv Doppler  11/01/2011  *RADIOLOGY REPORT*  Clinical Data:  Left testicular pain with swelling for several days  SCROTAL ULTRASOUND DOPPLER ULTRASOUND OF THE TESTICLES  Technique: Complete ultrasound examination of the testicles, epididymis, and other scrotal structures was performed.  Color and  spectral Doppler ultrasound were also utilized to evaluate blood flow to the testicles.  Comparison:  None  Findings:  Right testis:  The right testicle is normal in size with normal echogenicity.  Blood flow is demonstrated to the right testicle with arterial and venous waveforms.  There is a small area of diminished echogenicity inferiorly in the right testicle of questionable significance measuring approximately 5 x 6 mm.  Follow- up ultrasound in 03-4 months may be helpful to assess further.  Left testis:  The left testicle is normal in size and there is blood flow demonstrated with arterial and venous waveforms. However there is more vascularity within the left testicle and orchitis would be a consideration.  Right epididymis:  Right epididymis is unremarkable.  Left epididymis:  A small left epididymal cyst is present.  Hydrocele:  A small amount of fluid is noted in the right scrotum.  Varicocele:  No varicocele is seen.  Pulsed Doppler interrogation of both testes demonstrates low resistance flow bilaterally.  IMPRESSION:  1.  There is blood flow to both testicles. 2.  Blood flow to the left testicle is increased which could indicate orchitis.  Correlate clinically. 3.  Small amount of fluid in the right scrotum. 4.  Subtle inhomogeneous hypoechoic area inferiorly in the right testicle, of uncertain significance.  Recommend follow-up ultrasound in 3-4 months.   Original Report Authenticated By: Juline Patch, M.D.     Will refer to urology for abnormality in right testicle and possible orchitis on left side

## 2011-10-31 NOTE — Patient Instructions (Signed)
It was good to see you today. we'll make referral for ultrasound. Our office will contact you regarding appointment(s) once made. Alternate between ibuprofen and tylenol for aches, pain and fever symptoms as discussed

## 2011-11-01 ENCOUNTER — Ambulatory Visit
Admission: RE | Admit: 2011-11-01 | Discharge: 2011-11-01 | Disposition: A | Discharge status: Home / Self Care | Payer: BC Managed Care – PPO | Source: Ambulatory Visit | Attending: Internal Medicine | Admitting: Internal Medicine

## 2011-11-01 DIAGNOSIS — N50812 Left testicular pain: Secondary | ICD-10-CM

## 2011-11-01 NOTE — Addendum Note (Signed)
Addended by: Rene Paci A on: 11/01/2011 04:00 PM   Modules accepted: Orders

## 2011-12-29 ENCOUNTER — Encounter (HOSPITAL_COMMUNITY): Payer: Self-pay | Admitting: Emergency Medicine

## 2011-12-29 ENCOUNTER — Emergency Department (HOSPITAL_COMMUNITY)
Admission: EM | Admit: 2011-12-29 | Discharge: 2011-12-29 | Disposition: A | Discharge status: Home / Self Care | Payer: BC Managed Care – PPO | Attending: Emergency Medicine | Admitting: Emergency Medicine

## 2011-12-29 DIAGNOSIS — K219 Gastro-esophageal reflux disease without esophagitis: Secondary | ICD-10-CM | POA: Insufficient documentation

## 2011-12-29 DIAGNOSIS — L03317 Cellulitis of buttock: Secondary | ICD-10-CM | POA: Insufficient documentation

## 2011-12-29 DIAGNOSIS — L0231 Cutaneous abscess of buttock: Secondary | ICD-10-CM | POA: Insufficient documentation

## 2011-12-29 DIAGNOSIS — Z8669 Personal history of other diseases of the nervous system and sense organs: Secondary | ICD-10-CM | POA: Insufficient documentation

## 2011-12-29 HISTORY — DX: Cutaneous abscess of buttock: L02.31

## 2011-12-29 MED ORDER — LIDOCAINE-EPINEPHRINE (PF) 2 %-1:200000 IJ SOLN
20.0000 mL | Freq: Once | INTRAMUSCULAR | Status: AC
  Administered 2011-12-29: 10 mL via INTRADERMAL

## 2011-12-29 MED ORDER — HYDROCODONE-ACETAMINOPHEN 5-500 MG PO TABS: 1.0000 | ORAL_TABLET | Freq: Four times a day (QID) | ORAL | Status: DC | PRN

## 2011-12-29 NOTE — ED Provider Notes (Signed)
History     CSN: [id number]  Arrival date & time 12/29/11  1000   First MD Initiated Contact with Patient 12/29/11 1008      Chief Complaint  Patient presents with  . Abscess    (Consider location/radiation/quality/duration/timing/severity/associated sxs/prior treatment) HPI  31 year old morbidly obese male with history of recurrent abscess to buttock presents for evaluation of buttock abscess.  Pt reports abscess is gradual onset for the past 1 week, throbbing burning sensation worsening with pressure and improves when standing.  Rate as a 6/10 with pressure and 1/10 without pressure.  No rectal pain, no pain with defecation, no fever or rash.  No recent trauma.  Abscess usually burst on its own, but not this episode.    Past Medical History  Diagnosis Date  . COMMON MIGRAINE   . GERD   . OBESITY, MORBID   . Abscess of buttock, right     Past Surgical History  Procedure Date  . Lasik 2005    Family History  Problem Relation Age of Onset  . Diabetes Mother   . Alcohol abuse Father   . Arthritis Other     grandparent    History  Substance Use Topics  . Smoking status: Never Smoker   . Smokeless tobacco: Not on file  . Alcohol Use: Yes      Review of Systems  Constitutional: Negative for fever.  Genitourinary: Negative for dysuria, scrotal swelling and testicular pain.  Skin: Negative for rash.  Neurological: Negative for numbness.    Allergies  Review of patient's allergies indicates no known allergies.  Home Medications   Current Outpatient Rx  Name  Route  Sig  Dispense  Refill  . HYDROCODONE-HOMATROPINE 5-1.5 MG/5ML PO SYRP   Oral   Take 5 mLs by mouth every 6 (six) hours as needed for cough.   120 mL   0   . ONE-DAILY MULTI VITAMINS PO TABS   Oral   Take 1 tablet by mouth daily.             BP 144/93  Pulse 115  Temp 97.9 F (36.6 C) (Oral)  Resp 18  SpO2 100%  Physical Exam  Nursing note and vitals reviewed. Constitutional: He  appears well-developed and well-nourished. No distress.       Morbidly obese  HENT:  Head: Normocephalic and atraumatic.  Eyes: Conjunctivae normal are normal.  Neck: Normal range of motion. Neck supple.  Musculoskeletal: He exhibits tenderness (cutaneous abscess to medial aspect of L buttock adjacent to anus, ttp, no rectal involvement.  no rash).  Neurological: He is alert.  Skin: Skin is warm. No rash noted.  Psychiatric: He has a normal mood and affect.    ED Course  Procedures (including critical care time)  Labs Reviewed - No data to display No results found.   No diagnosis found.  INCISION AND DRAINAGE Performed by: Fayrene Helper Consent: Verbal consent obtained. Risks and benefits: risks, benefits and alternatives were discussed Type: abscess  Body area: left buttock  Anesthesia: local infiltration  Incision was made with a scalpel.  Local anesthetic: lidocaine 2% w epinephrine  Anesthetic total: 3 ml  Complexity: complex Blunt dissection to break up loculations  Drainage: purulent  Drainage amount: moderate  Packing material: 1/4 in iodoform gauze  Patient tolerance: Patient tolerated the procedure well with no immediate complications.  1. Cutaneous abscess of L buttock   MDM  Cutaneous abscess of L buttock with successful I&D.  No rectal involvement.  Care  instruction including sitz bath, warm compress, soap/water were discussed.  Pt to remove packing in 2 days, return sooner if worsen.  No abx necessary at this time.    10:50 AM BP 144/93  Pulse 115  Temp 97.9 F (36.6 C) (Oral)  Resp 18  SpO2 100% Elevated HR due to pt's anxiety about procedure.         Fayrene Helper, PA-C 12/29/11 1101

## 2011-12-29 NOTE — ED Notes (Signed)
Pt states that he has a recurring abscess on his right butt cheek.  States that he has had it before but was given abx and it went away.

## 2011-12-29 NOTE — ED Provider Notes (Signed)
Medical screening examination/treatment/procedure(s) were performed by non-physician practitioner and as supervising physician I was immediately available for consultation/collaboration.  Rosamary Boudreau R. Pranavi Aure, MD 12/29/11 1545 

## 2012-01-03 ENCOUNTER — Ambulatory Visit (INDEPENDENT_AMBULATORY_CARE_PROVIDER_SITE_OTHER): Payer: BC Managed Care – PPO | Admitting: Internal Medicine

## 2012-01-03 ENCOUNTER — Encounter: Payer: Self-pay | Admitting: Internal Medicine

## 2012-01-03 VITALS — BP 110/68 | HR 103 | Temp 98.2°F | Ht 71.0 in | Wt 343.1 lb

## 2012-01-03 DIAGNOSIS — J329 Chronic sinusitis, unspecified: Secondary | ICD-10-CM

## 2012-01-03 DIAGNOSIS — J029 Acute pharyngitis, unspecified: Secondary | ICD-10-CM

## 2012-01-03 DIAGNOSIS — L03317 Cellulitis of buttock: Secondary | ICD-10-CM

## 2012-01-03 DIAGNOSIS — L0231 Cutaneous abscess of buttock: Secondary | ICD-10-CM

## 2012-01-03 MED ORDER — AMOXICILLIN-POT CLAVULANATE 875-125 MG PO TABS: 1.0000 | ORAL_TABLET | Freq: Two times a day (BID) | ORAL | Status: AC

## 2012-01-03 MED ORDER — DIPHENHYD-HYDROCORT-NYSTATIN MT SUSP: 5.0000 mL | Freq: Three times a day (TID) | OROMUCOSAL | Status: DC | PRN

## 2012-01-03 NOTE — Patient Instructions (Signed)
It was good to see you today. Zpak antibiotics and prescription mouthwash - Your prescription(s) have been submitted to your pharmacy. Please take as directed and contact our office if you believe you are having problem(s) with the medication(s). Alternate between ibuprofen and tylenol for aches, pain and fever symptoms as discussed Hydrate, rest and call if worse or unimproved

## 2012-01-03 NOTE — Progress Notes (Signed)
  Subjective:   HPI  complains of sore throat and head cold symptoms  Onset >1 week ago, progressively worse symptoms  associated with sore throat, mild headache and low grade fever Also myalgias, sinus pressure and mild-mod chest congestion No relief with OTC meds Precipitated by sick contacts- dtr in daycare  Past Medical History  Diagnosis Date  . COMMON MIGRAINE   . GERD   . OBESITY, MORBID   . Abscess of buttock, right     Review of Systems Constitutional: No fever, no unexpected weight change Pulmonary: No pleurisy or hemoptysis Cardiovascular: No chest pain or palpitations     Objective:   Physical Exam BP 110/68  Pulse 103  Temp 98.2 F (36.8 C) (Oral)  Ht 5\' 11"  (1.803 m)  Wt 343 lb 1.9 oz (155.638 kg)  BMI 47.86 kg/m2  SpO2 95% GEN: obese, mildly ill appearing and audible head congestion HENT: NCAT, mild max sinus tenderness bilaterally, nares with thick yellow discharge, oropharynx mod erythema, no exudate Eyes: Vision grossly intact, no conjunctivitis Lungs: Few rhonchi - no wheeze, no increased work of breathing but coughing effort with inspiration Cardiovascular: Regular rate and rhythm, no bilateral edema  Lab Results  Component Value Date   WBC 7.7 03/12/2011   HGB 13.6 03/12/2011   HCT 41.2 03/12/2011   PLT 251.0 03/12/2011   GLUCOSE 95 03/12/2011   CHOL 176 03/12/2011   TRIG 99.0 03/12/2011   HDL 42.30 03/12/2011   LDLCALC 114* 03/12/2011   ALT 38 03/12/2011   AST 22 03/12/2011   NA 140 03/12/2011   K 4.3 03/12/2011   CL 105 03/12/2011   CREATININE 0.9 03/12/2011   BUN 14 03/12/2011   CO2 27 03/12/2011   TSH 2.24 03/12/2011      Assessment & Plan:  acute pharyngitis sinusitis and cough, postnasal drip related to same   Empiric antibiotics prescribed due to symptom duration greater than 7 days Magic mouthwash Prescription cough suppression offered but declines - Symptomatic care with Tylenol or Advil, hydration and rest -  salt gargle advised as  needed

## 2012-01-03 NOTE — Assessment & Plan Note (Signed)
1st episode 12/2010 2nd episode: ER visit 12/2011 reviewed -s/p I&D and packing Will refer to surg if 3rd recurrence - pt agrees and will call if problems, currently improved

## 2012-05-25 ENCOUNTER — Encounter (INDEPENDENT_AMBULATORY_CARE_PROVIDER_SITE_OTHER): Payer: Self-pay | Admitting: General Surgery

## 2012-05-25 ENCOUNTER — Ambulatory Visit (INDEPENDENT_AMBULATORY_CARE_PROVIDER_SITE_OTHER): Payer: BC Managed Care – PPO | Admitting: General Surgery

## 2012-05-25 ENCOUNTER — Ambulatory Visit (INDEPENDENT_AMBULATORY_CARE_PROVIDER_SITE_OTHER): Payer: BC Managed Care – PPO | Admitting: Internal Medicine

## 2012-05-25 ENCOUNTER — Encounter: Payer: Self-pay | Admitting: Internal Medicine

## 2012-05-25 VITALS — BP 120/72 | HR 84 | Temp 99.2°F

## 2012-05-25 VITALS — BP 122/72 | HR 82 | Temp 97.7°F | Resp 18 | Ht 71.0 in | Wt 314.0 lb

## 2012-05-25 DIAGNOSIS — L03317 Cellulitis of buttock: Secondary | ICD-10-CM

## 2012-05-25 DIAGNOSIS — L0231 Cutaneous abscess of buttock: Secondary | ICD-10-CM

## 2012-05-25 MED ORDER — IBUPROFEN 200 MG PO TABS: 400.0000 mg | ORAL_TABLET | Freq: Four times a day (QID) | ORAL | Status: DC | PRN

## 2012-05-25 MED ORDER — SULFAMETHOXAZOLE-TRIMETHOPRIM 800-160 MG PO TABS: 1.0000 | ORAL_TABLET | Freq: Two times a day (BID) | ORAL | Status: DC

## 2012-05-25 NOTE — Progress Notes (Signed)
  Subjective:    Patient ID: Stephen Freeman, male    DOB: 03/16/1980, 32 y.o.   MRN: [id number]  HPI  Complains of abscess right side buttock History of same, same location Onset current symptoms one week ago Associated with swelling, pain and discomfort -spontaneous drainage of blood this morning   Past Medical History  Diagnosis Date  . COMMON MIGRAINE   . GERD   . OBESITY, MORBID   . Abscess of buttock, right     Review of Systems  Constitutional: Negative for fever, fatigue and unexpected weight change (intentional 20 pound weight loss since the beginning of 2014).  Gastrointestinal: Negative for abdominal pain, diarrhea, constipation and rectal pain.  Genitourinary: Negative for hematuria.       Objective:   Physical Exam BP 120/72  Pulse 84  Temp(Src) 99.2 F (37.3 C) (Oral)  SpO2 97% Wt Readings from Last 3 Encounters:  01/03/12 343 lb 1.9 oz (155.638 kg)  10/31/11 337 lb (152.862 kg)  10/15/11 335 lb (151.955 kg)   Constitutional: he is overweight, but appears well-developed and well-nourished. Nontoxic, no distress.   Neck: Normal range of motion. Neck supple. No JVD present. No thyromegaly present.  Cardiovascular: Normal rate, regular rhythm and normal heart sounds.  No murmur heard. No BLE edema. Pulmonary/Chest: Effort normal and breath sounds normal. No respiratory distress. he has no wheezes.  Skin: 2 cm soft fluctuant abscess on right side of the buttock inner fold approximately 1 cm edge from anal verge. No surrounding cellulitis. Spontaneous  Drainage with dark red blood and serous material expressed. Skin is warm and dry. No rash noted. No erythema.  Psychiatric: he has a normal mood and affect. behavior is normal. Judgment and thought content normal.       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

## 2012-05-25 NOTE — Progress Notes (Addendum)
  Subjective:    Patient ID: Stephen Freeman, male    DOB: 14-May-1980, 32 y.o.   MRN: [id number]  HPI  Pt here regarding a CC of abscess on the right buttocks that has been there for the past week.  The abscess started actively draining this morning.  He denies any fever, chills, nausea, and vomiting.  He has been using warm compresses and keeping the area clean and dry at home.    He had a similar abscess several months ago that was I&D'd and treated with oral antibiotics.     Review of Systems  Constitutional: Negative for fever and chills.  Gastrointestinal: Negative for nausea and vomiting.  Skin: Positive for wound.  All other systems reviewed and are negative.       Objective:   Physical Exam  Nursing note and vitals reviewed. Constitutional: He is oriented to person, place, and time. He appears well-developed and well-nourished.  Cardiovascular: Normal rate, regular rhythm and normal heart sounds.  Exam reveals no gallop and no friction rub.   No murmur heard. Pulmonary/Chest: Effort normal and breath sounds normal.  Neurological: He is alert and oriented to person, place, and time.  Skin:     Psychiatric: He has a normal mood and affect. His behavior is normal.          Assessment & Plan:  Pt presents to the office with a CC of abscess on the right buttock  Abscess was expressed in the office. Bactrim DS BID x 7 days Sitz bath, keep area clean and dry and use 4x4 gauze Refer to general surgery for evaluation Tylenol or ibuprofen for pain control  Aftercare instructions provided.  Pt. Instructed to call the office if he has fever, chills, nausea, vomiting, or increasing redness in the area.     I have personally reviewed this case with PA student. I also personally examined this patient. I agree with history and findings as documented above. I reviewed, discussed and approve of the assessment and plan as listed above. Rene Paci, MD

## 2012-05-25 NOTE — Patient Instructions (Signed)
It was good to see you today. We have expressed fluid from your abscess in the office today - Begin antibiotics Septra twice daily with food for one week - Your prescription(s) have been submitted to your pharmacy. Please take as directed and contact our office if you believe you are having problem(s) with the medication(s). Alternate between ibuprofen and tylenol for aches, pain and fever symptoms as discussed Will refer to Gen. Surgery for evaluation of recurrent infection at the same site - my office will call you regarding this appointment Also treat infection with sitz bath as discussed, see directions below Call if increasing pain, drainage, swelling or other problems between now and evaluation by surgeon  Sitz Bath A sitz bath is a warm water bath taken in the sitting position that covers only the hips and buttocks. It may be used for either healing or hygiene purposes. Sitz baths are also used to relieve pain, itching, or muscle spasms. The water may contain medicine. Moist heat will help you heal and relax.  HOME CARE INSTRUCTIONS   Fill the bathtub half full with warm water.  Sit in the water and open the drain a little.  Turn on the warm water to keep the tub half full. Keep the water running constantly.  Soak in the water for 15 to 20 minutes.  After the sitz bath, pat the affected area dry first.  Take 3 to 4 sitz baths a day. SEEK MEDICAL CARE IF:  You get worse instead of better. Stop the sitz baths if you get worse. MAKE SURE YOU:  Understand these instructions.  Will watch your condition.  Will get help right away if you are not doing well or get worse. Document Released: 10/07/2003 Document Revised: 04/08/2011 Document Reviewed: 04/13/2010 [facility] Patient Information 2013 [place], [place]. Abscess An abscess is an infected area that contains a collection of pus and debris.It can occur in almost any part of the body. An abscess is also known as a furuncle or  boil. CAUSES  An abscess occurs when tissue gets infected. This can occur from blockage of oil or sweat glands, infection of hair follicles, or a minor injury to the skin. As the body tries to fight the infection, pus collects in the area and creates pressure under the skin. This pressure causes pain. People with weakened immune systems have difficulty fighting infections and get certain abscesses more often.  SYMPTOMS Usually an abscess develops on the skin and becomes a painful mass that is red, warm, and tender. If the abscess forms under the skin, you may feel a moveable soft area under the skin. Some abscesses break open (rupture) on their own, but most will continue to get worse without care. The infection can spread deeper into the body and eventually into the bloodstream, causing you to feel ill.  DIAGNOSIS  Your caregiver will take your medical history and perform a physical exam. A sample of fluid may also be taken from the abscess to determine what is causing your infection. TREATMENT  Your caregiver may prescribe antibiotic medicines to fight the infection. However, taking antibiotics alone usually does not cure an abscess. Your caregiver may need to make a small cut (incision) in the abscess to drain the pus. In some cases, gauze is packed into the abscess to reduce pain and to continue draining the area. HOME CARE INSTRUCTIONS   Only take over-the-counter or prescription medicines for pain, discomfort, or fever as directed by your caregiver.  If you were prescribed  antibiotics, take them as directed. Finish them even if you start to feel better.  If gauze is used, follow your caregiver's directions for changing the gauze.  To avoid spreading the infection:  Keep your draining abscess covered with a bandage.  Wash your hands well.  Do not share personal care items, towels, or whirlpools with others.  Avoid skin contact with others.  Keep your skin and clothes clean around the  abscess.  Keep all follow-up appointments as directed by your caregiver. SEEK MEDICAL CARE IF:   You have increased pain, swelling, redness, fluid drainage, or bleeding.  You have muscle aches, chills, or a general ill feeling.  You have a fever. MAKE SURE YOU:   Understand these instructions.  Will watch your condition.  Will get help right away if you are not doing well or get worse. Document Released: 10/24/2004 Document Revised: 07/16/2011 Document Reviewed: 03/29/2011 [facility] Patient Information 2013 [place], [place].

## 2012-05-25 NOTE — Patient Instructions (Signed)
Remove packing tomorrow and soak in tub 2-3 times a day Keep clean dry gauze on area as needed

## 2012-05-26 ENCOUNTER — Encounter (INDEPENDENT_AMBULATORY_CARE_PROVIDER_SITE_OTHER): Payer: Self-pay | Admitting: General Surgery

## 2012-05-26 NOTE — Progress Notes (Signed)
Subjective:     Patient ID: Stephen Freeman, male   DOB: 05-14-1980, 32 y.o.   MRN: [id number]  HPI We're asked to see the patient in consultation by Dr. Felicity Coyer to evaluate him for a perirectal abscess. The patient is a 32 year old white male who developed a painful area near his rectum about a week ago. Her last day the area has opened and drained. The pain has improved. He denies any fevers or chills. He has had this similar abscess in the same area a couple of times over the last few years. He denies any discharge from his rectum.  Review of Systems  Constitutional: Negative.   HENT: Negative.   Eyes: Negative.   Respiratory: Negative.   Cardiovascular: Negative.   Gastrointestinal: Positive for rectal pain.  Endocrine: Negative.   Genitourinary: Negative.   Musculoskeletal: Negative.   Skin: Negative.   Allergic/Immunologic: Negative.   Neurological: Negative.   Hematological: Negative.   Psychiatric/Behavioral: Negative.        Objective:   Physical Exam  Constitutional: He is oriented to person, place, and time.  Morbidly obese white male  HENT:  Head: Normocephalic and atraumatic.  Eyes: Conjunctivae and EOM are normal. Pupils are equal, round, and reactive to light.  Neck: Normal range of motion. Neck supple.  Cardiovascular: Normal rate, regular rhythm and normal heart sounds.   Pulmonary/Chest: Effort normal and breath sounds normal.  Abdominal: Soft. Bowel sounds are normal.  Genitourinary:  There is an open area in the skin just lateral to the rectum on the right side. There is no palpable cord towards the rectum. The area was probed with a Q-tip in the cavity does not appear to communicate very deep. Very little pus was evacuated. The wound was packed with gauze and he tolerated this well.  Musculoskeletal: Normal range of motion.  Neurological: He is alert and oriented to person, place, and time.  Skin: Skin is warm and dry.  Psychiatric: He has a normal mood and  affect. His behavior is normal.       Assessment:     The patient appears to have a perirectal abscess that had spontaneously drained. I have instructed him on wound care. I have offered him an exam under anesthesia to make sure that he does not have a fistula given that this area is recurrent but he declines.     Plan:     We'll plan for local wound care and we will see him back in about 3 weeks to check his progress

## 2012-06-09 ENCOUNTER — Encounter (INDEPENDENT_AMBULATORY_CARE_PROVIDER_SITE_OTHER): Payer: Self-pay | Admitting: General Surgery

## 2012-06-09 ENCOUNTER — Ambulatory Visit (INDEPENDENT_AMBULATORY_CARE_PROVIDER_SITE_OTHER): Payer: BC Managed Care – PPO | Admitting: General Surgery

## 2012-06-09 VITALS — BP 138/68 | HR 76 | Temp 98.7°F | Resp 16 | Ht 71.0 in | Wt 315.6 lb

## 2012-06-09 DIAGNOSIS — L0231 Cutaneous abscess of buttock: Secondary | ICD-10-CM

## 2012-06-09 DIAGNOSIS — L03317 Cellulitis of buttock: Secondary | ICD-10-CM

## 2012-06-09 NOTE — Progress Notes (Signed)
Subjective:     Patient ID: Stephen Freeman, male   DOB: Jul 22, 1980, 32 y.o.   MRN: [id number]  HPI The patient is a 32 year old white male who we saw 2 weeks ago with a perirectal abscess. The abscess spontaneously drained. He has been keeping the area clean and dry and he feels as though the area has healed. He denies any perirectal pain. He denies any perirectal drainage. His appetite is good and his bowels are working normally.  Review of Systems     Objective:   Physical Exam On exam his perirectal skin looks good. There is no evidence of persistent abscess or fistula.    Assessment:     The patient is status post drainage of a perirectal abscess     Plan:     At this point he appears to be completely healed. He will keep the area clean and dry and call us if he has any recurrence of his symptoms

## 2012-06-09 NOTE — Patient Instructions (Signed)
Keep area clean and dry. °

## 2012-06-22 ENCOUNTER — Ambulatory Visit (INDEPENDENT_AMBULATORY_CARE_PROVIDER_SITE_OTHER): Payer: BC Managed Care – PPO | Admitting: Emergency Medicine

## 2012-06-22 VITALS — BP 140/90 | HR 100 | Temp 99.8°F | Resp 16 | Ht 71.0 in | Wt 316.0 lb

## 2012-06-22 DIAGNOSIS — J018 Other acute sinusitis: Secondary | ICD-10-CM

## 2012-06-22 DIAGNOSIS — J209 Acute bronchitis, unspecified: Secondary | ICD-10-CM

## 2012-06-22 MED ORDER — AMOXICILLIN-POT CLAVULANATE 875-125 MG PO TABS: 1.0000 | ORAL_TABLET | Freq: Two times a day (BID) | ORAL | Status: DC

## 2012-06-22 MED ORDER — PSEUDOEPHEDRINE-GUAIFENESIN ER 60-600 MG PO TB12: 1.0000 | ORAL_TABLET | Freq: Two times a day (BID) | ORAL | Status: DC

## 2012-06-22 MED ORDER — HYDROCOD POLST-CHLORPHEN POLST 10-8 MG/5ML PO LQCR: 5.0000 mL | Freq: Two times a day (BID) | ORAL | Status: DC | PRN

## 2012-06-22 NOTE — Progress Notes (Signed)
[facility] [address] [place] [address] [phone]  Date:  06/22/2012   Name:  Stephen Freeman   DOB:  [birth year]   MRN:  [id number]  PCP:  Rene Paci, MD    Chief Complaint: URI and Cough   History of Present Illness:  Stephen Freeman is a 32 y.o. very pleasant male patient who presents with the following:  Young man with nasal congestion and mucoid drainage for two weeks.  Now has increased drainage that is purulent in nature associated with a non productive cough and fever.  No wheezing or shortness of breath.  No nausea or vomiting. Fever began Saturday night.  Moderate post nasal drainage.  No sore throat.  No improvement with over the counter medications or other home remedies. Denies other complaint or health concern today.   Patient Active Problem List   Diagnosis Date Noted  . Cellulitis and abscess of buttock 01/17/2011  . OBESITY, MORBID 06/16/2009  . COMMON MIGRAINE 06/16/2009  . GERD 06/16/2009    Past Medical History  Diagnosis Date  . COMMON MIGRAINE   . GERD   . OBESITY, MORBID   . Abscess of buttock, right     Past Surgical History  Procedure Laterality Date  . Lasik  2005    History  Substance Use Topics  . Smoking status: Never Smoker   . Smokeless tobacco: Not on file  . Alcohol Use: Yes    Family History  Problem Relation Age of Onset  . Diabetes Mother   . Alcohol abuse Father   . Arthritis Other     grandparent    No Known Allergies  Medication list has been reviewed and updated.  Current Outpatient Prescriptions on File Prior to Visit  Medication Sig Dispense Refill  . ibuprofen (ADVIL,MOTRIN) 200 MG tablet Take 2 tablets (400 mg total) by mouth every 6 (six) hours as needed for pain. For pain  30 tablet     No current facility-administered medications on file prior to visit.    Review of Systems:  As per HPI, otherwise negative.    Physical Examination: Filed Vitals:   06/22/12 0925  BP:  140/90  Pulse: 100  Temp: 99.8 F (37.7 C)  Resp: 16   Filed Vitals:   06/22/12 0925  Height: 5\' 11"  (1.803 m)  Weight: 316 lb (143.337 kg)   Body mass index is 44.09 kg/(m^2). Ideal Body Weight: Weight in (lb) to have BMI = 25: 178.9  GEN: morbidly obese, NAD, Non-toxic, A & O x 3 HEENT: Atraumatic, Normocephalic. Neck supple. No masses, No LAD. Ears and Nose: No external deformity. CV: RRR, No M/G/R. No JVD. No thrill. No extra heart sounds. PULM: CTA B, no wheezes, crackles, rhonchi. No retractions. No resp. distress. No accessory muscle use. ABD: S, NT, ND, +BS. No rebound. No HSM. EXTR: No c/c/e NEURO Normal gait.  PSYCH: Normally interactive. Conversant. Not depressed or anxious appearing.  Calm demeanor.    Assessment and Plan: Sinusitis  Bronchitis Augmentin mucinex tussionex   Signed,  Phillips Odor, MD

## 2012-06-22 NOTE — Patient Instructions (Addendum)

## 2012-08-13 ENCOUNTER — Ambulatory Visit (INDEPENDENT_AMBULATORY_CARE_PROVIDER_SITE_OTHER): Payer: BC Managed Care – PPO | Admitting: Internal Medicine

## 2012-08-13 ENCOUNTER — Encounter: Payer: Self-pay | Admitting: Internal Medicine

## 2012-08-13 VITALS — BP 118/78 | HR 67 | Temp 99.0°F

## 2012-08-13 DIAGNOSIS — T148 Other injury of unspecified body region: Secondary | ICD-10-CM

## 2012-08-13 DIAGNOSIS — W57XXXA Bitten or stung by nonvenomous insect and other nonvenomous arthropods, initial encounter: Secondary | ICD-10-CM

## 2012-08-13 MED ORDER — TRIAMCINOLONE ACETONIDE 0.025 % EX OINT: TOPICAL_OINTMENT | Freq: Two times a day (BID) | CUTANEOUS | Status: DC

## 2012-08-13 NOTE — Patient Instructions (Signed)
Insect Bite  Mosquitoes, flies, fleas, bedbugs, and many other insects can bite. Insect bites are different from insect stings. A sting is when venom is injected into the skin. Some insect bites can transmit infectious diseases.  SYMPTOMS   Insect bites usually turn red, swell, and itch for 2 to 4 days. They often go away on their own.  TREATMENT   Your caregiver may prescribe antibiotic medicines if a bacterial infection develops in the bite.  HOME CARE INSTRUCTIONS   Do not scratch the bite area.   Keep the bite area clean and dry. Wash the bite area thoroughly with soap and water.   Put ice or cool compresses on the bite area.   Put ice in a plastic bag.   Place a towel between your skin and the bag.   Leave the ice on for 20 minutes, 4 times a day for the first 2 to 3 days, or as directed.   You may apply a baking soda paste, cortisone cream, or calamine lotion to the bite area as directed by your caregiver. This can help reduce itching and swelling.   Only take over-the-counter or prescription medicines as directed by your caregiver.   If you are given antibiotics, take them as directed. Finish them even if you start to feel better.  You may need a tetanus shot if:   You cannot remember when you had your last tetanus shot.   You have never had a tetanus shot.   The injury broke your skin.  If you get a tetanus shot, your arm may swell, get red, and feel warm to the touch. This is common and not a problem. If you need a tetanus shot and you choose not to have one, there is a rare chance of getting tetanus. Sickness from tetanus can be serious.  SEEK IMMEDIATE MEDICAL CARE IF:    You have increased pain, redness, or swelling in the bite area.   You see a red line on the skin coming from the bite.   You have a fever.   You have joint pain.   You have a headache or neck pain.   You have unusual weakness.   You have a rash.   You have chest pain or shortness of breath.    You have abdominal pain, nausea, or vomiting.   You feel unusually tired or sleepy.  MAKE SURE YOU:    Understand these instructions.   Will watch your condition.   Will get help right away if you are not doing well or get worse.  Document Released: 02/22/2004 Document Revised: 04/08/2011 Document Reviewed: 08/15/2010  [employer] Patient Information 2014 [employer]

## 2012-08-13 NOTE — Progress Notes (Signed)
Subjective:    Patient ID: Stephen Freeman, male    DOB: [birth year], 32 y.o.   MRN: [id number]  HPI  Pt presents to the clinic today with c/o a bug bite. This is on his left leg. This occurred on Sunday. He had been working outside prior to this. The area is tender. It does itch. He has put hydrocortisone cream on it. He does not remember got bitten by anything. He denies fever, chills or body aches.  Review of Systems      Past Medical History  Diagnosis Date  . COMMON MIGRAINE   . GERD   . OBESITY, MORBID   . Abscess of buttock, right     Current Outpatient Prescriptions  Medication Sig Dispense Refill  . amoxicillin-clavulanate (AUGMENTIN) 875-125 MG per tablet Take 1 tablet by mouth 2 (two) times daily.  20 tablet  0  . chlorpheniramine-HYDROcodone (TUSSIONEX PENNKINETIC ER) 10-8 MG/5ML LQCR Take 5 mLs by mouth every 12 (twelve) hours as needed.  60 mL  0  . ibuprofen (ADVIL,MOTRIN) 200 MG tablet Take 2 tablets (400 mg total) by mouth every 6 (six) hours as needed for pain. For pain  30 tablet    . pseudoephedrine-guaifenesin (MUCINEX D) 60-600 MG per tablet Take 1 tablet by mouth every 12 (twelve) hours.  18 tablet  0   No current facility-administered medications for this visit.    No Known Allergies  Family History  Problem Relation Age of Onset  . Diabetes Mother   . Alcohol abuse Father   . Arthritis Other     grandparent    History   Social History  . Marital Status: Married    Spouse Name: N/A    Number of Children: N/A  . Years of Education: N/A   Occupational History  . Not on file.   Social History Main Topics  . Smoking status: Never Smoker   . Smokeless tobacco: Not on file  . Alcohol Use: Yes  . Drug Use: No  . Sexually Active: Not Currently   Other Topics Concern  . Not on file   Social History Narrative  . No narrative on file     Constitutional: Denies fever, malaise, fatigue, headache or abrupt weight changes.   Skin: Pt reports bug  bite on left leg. Denies redness, rashes, lesions or ulcercations.    No other specific complaints in a complete review of systems (except as listed in HPI above).  Objective:   Physical Exam   BP 118/78  Pulse 67  Temp(Src) 99 F (37.2 C) (Oral)  SpO2 97% Wt Readings from Last 3 Encounters:  06/22/12 316 lb (143.337 kg)  06/09/12 315 lb 9.6 oz (143.155 kg)  05/25/12 314 lb (142.429 kg)    General: Appears his stated age, obese but well developed, well nourished in NAD. Skin: Warm, dry and intact. 6 cm round, non blanchable nodule noted on left lateral leg. Cardiovascular: Normal rate and rhythm. S1,S2 noted.  No murmur, rubs or gallops noted. No JVD or BLE edema. No carotid bruits noted. Pulmonary/Chest: Normal effort and positive vesicular breath sounds. No respiratory distress. No wheezes, rales or ronchi noted.    BMET    Component Value Date/Time   NA 140 03/12/2011 0733   K 4.3 03/12/2011 0733   CL 105 03/12/2011 0733   CO2 27 03/12/2011 0733   GLUCOSE 95 03/12/2011 0733   BUN 14 03/12/2011 0733   CREATININE 0.9 03/12/2011 0733   CALCIUM 9.2 03/12/2011 0733  GFRNONAA 90.70 06/12/2009 0756    Lipid Panel     Component Value Date/Time   CHOL 176 03/12/2011 0733   TRIG 99.0 03/12/2011 0733   HDL 42.30 03/12/2011 0733   CHOLHDL 4 03/12/2011 0733   VLDL 19.8 03/12/2011 0733   LDLCALC 114* 03/12/2011 0733    CBC    Component Value Date/Time   WBC 7.7 03/12/2011 0733   RBC 4.63 03/12/2011 0733   HGB 13.6 03/12/2011 0733   HCT 41.2 03/12/2011 0733   PLT 251.0 03/12/2011 0733   MCV 89.0 03/12/2011 0733   MCHC 33.0 03/12/2011 0733   RDW 14.0 03/12/2011 0733   LYMPHSABS 2.6 03/12/2011 0733   MONOABS 0.8 03/12/2011 0733   EOSABS 0.2 03/12/2011 0733   BASOSABS 0.1 03/12/2011 0733    Hgb A1C No results found for this basename: HGBA1C         Assessment & Plan:   Bug bite of left leg, new onset:  eRx for triamcinolone cream Benadryl as needed  RTC as needed or if  symptoms persist or worsen

## 2012-11-20 ENCOUNTER — Ambulatory Visit: Payer: Self-pay

## 2012-11-20 ENCOUNTER — Ambulatory Visit (INDEPENDENT_AMBULATORY_CARE_PROVIDER_SITE_OTHER): Payer: BC Managed Care – PPO | Admitting: Emergency Medicine

## 2012-11-20 VITALS — BP 108/78 | HR 92 | Temp 98.4°F | Resp 20 | Ht 69.25 in | Wt 316.0 lb

## 2012-11-20 DIAGNOSIS — R05 Cough: Secondary | ICD-10-CM

## 2012-11-20 DIAGNOSIS — R062 Wheezing: Secondary | ICD-10-CM

## 2012-11-20 DIAGNOSIS — R059 Cough, unspecified: Secondary | ICD-10-CM

## 2012-11-20 LAB — POCT CBC
Granulocyte percent: 53.6 %G (ref 37–80)
HCT, POC: 40.1 % — AB (ref 43.5–53.7)
Hemoglobin: 12.4 g/dL — AB (ref 14.1–18.1)
Lymph, poc: 2.6 (ref 0.6–3.4)
MCH, POC: 28.5 pg (ref 27–31.2)
MCHC: 30.9 g/dL — AB (ref 31.8–35.4)
MCV: 92.2 fL (ref 80–97)
MID (cbc): 0.6 (ref 0–0.9)
MPV: 8.4 fL (ref 0–99.8)
POC Granulocyte: 3.6 (ref 2–6.9)
POC LYMPH PERCENT: 37.7 %L (ref 10–50)
POC MID %: 8.7 %M (ref 0–12)
Platelet Count, POC: 204 10*3/uL (ref 142–424)
RBC: 4.35 M/uL — AB (ref 4.69–6.13)
RDW, POC: 15.2 %
WBC: 6.8 10*3/uL (ref 4.6–10.2)

## 2012-11-20 MED ORDER — ALBUTEROL SULFATE (2.5 MG/3ML) 0.083% IN NEBU
2.5000 mg | INHALATION_SOLUTION | Freq: Once | RESPIRATORY_TRACT | Status: AC
  Administered 2012-11-20: 2.5 mg via RESPIRATORY_TRACT

## 2012-11-20 MED ORDER — METHYLPREDNISOLONE (PAK) 4 MG PO TABS: ORAL_TABLET | ORAL | Status: DC

## 2012-11-20 NOTE — Progress Notes (Signed)
  Subjective:    Patient ID: Stephen Freeman, male    DOB: [birth year], 32 y.o.   MRN: [id number]  Cough Associated symptoms include postnasal drip and shortness of breath. Pertinent negatives include no chest pain, chills, fever, headaches or sore throat.   32 year old male presents for evaluation of cough x 2 weeks. States symptoms started as allergic and developed into a cold. All URI sx's have now resolved but he continues to have a nonproductive, hacking cough associated with some chest tightness and "gurgling" in his chest.  Denies fever, chills, nausea, vomiting, otalgia, sore throat, sinus pain, nasal congestion, or headache.  Does have some PND.   Patient is otherwise healthy with no other concerns today.     Review of Systems  Constitutional: Negative for fever and chills.  HENT: Positive for postnasal drip. Negative for congestion, sinus pressure, sore throat and trouble swallowing.   Respiratory: Positive for cough, chest tightness and shortness of breath.   Cardiovascular: Negative for chest pain.  Gastrointestinal: Negative for nausea and vomiting.  Neurological: Negative for dizziness and headaches.       Objective:   Physical Exam  Constitutional: He is oriented to person, place, and time. He appears well-developed and well-nourished.  HENT:  Head: Normocephalic and atraumatic.  Right Ear: Hearing, tympanic membrane, external ear and ear canal normal.  Left Ear: Hearing, tympanic membrane, external ear and ear canal normal.  Mouth/Throat: Uvula is midline, oropharynx is clear and moist and mucous membranes are normal. No oropharyngeal exudate (clear postnasal drainage).  Eyes: Conjunctivae are normal.  Neck: Normal range of motion. Neck supple.  Cardiovascular: Normal rate, regular rhythm and normal heart sounds.   Pulmonary/Chest: Effort normal. He has wheezes.  Lymphadenopathy:    He has no cervical adenopathy.  Neurological: He is alert and oriented to person, place,  and time.  Psychiatric: He has a normal mood and affect. His behavior is normal. Judgment and thought content normal.         [facility] reading (PRIMARY) by  Dr. Cleta Alberts as no acute infiltrate or consolidation. Peak flow pre-nebulizer 600. Post nebulizer 700.   Patient reports significant improvement in symptoms s/p nebulizer. Wheezing improved.  Assessment & Plan:  Cough - Plan: DG Chest 2 View, POCT CBC, albuterol (PROVENTIL) (2.5 MG/3ML) 0.083% nebulizer solution 2.5 mg, methylPREDNIsolone (MEDROL DOSPACK) 4 MG tablet  Wheezing - Plan: methylPREDNIsolone (MEDROL DOSPACK) 4 MG tablet  RAD - start medrol dose pack, use as directed Recommend OTC Delsym q12hours RTC precautions including fever, chills, productive cough, or worsening chest tightness

## 2012-12-02 ENCOUNTER — Ambulatory Visit (INDEPENDENT_AMBULATORY_CARE_PROVIDER_SITE_OTHER): Payer: BC Managed Care – PPO | Admitting: Internal Medicine

## 2012-12-02 ENCOUNTER — Encounter: Payer: Self-pay | Admitting: Internal Medicine

## 2012-12-02 VITALS — BP 124/72 | HR 97 | Temp 98.7°F | Wt 317.4 lb

## 2012-12-02 DIAGNOSIS — J9801 Acute bronchospasm: Secondary | ICD-10-CM

## 2012-12-02 DIAGNOSIS — J45909 Unspecified asthma, uncomplicated: Secondary | ICD-10-CM

## 2012-12-02 DIAGNOSIS — J309 Allergic rhinitis, unspecified: Secondary | ICD-10-CM

## 2012-12-02 DIAGNOSIS — R05 Cough: Secondary | ICD-10-CM

## 2012-12-02 MED ORDER — BENZONATATE 200 MG PO CAPS: 200.0000 mg | ORAL_CAPSULE | Freq: Three times a day (TID) | ORAL | Status: DC | PRN

## 2012-12-02 MED ORDER — ALBUTEROL SULFATE HFA 108 (90 BASE) MCG/ACT IN AERS: 2.0000 | INHALATION_SPRAY | Freq: Four times a day (QID) | RESPIRATORY_TRACT | Status: DC | PRN

## 2012-12-02 MED ORDER — LORATADINE 10 MG PO TABS: 10.0000 mg | ORAL_TABLET | Freq: Every day | ORAL | Status: DC

## 2012-12-02 NOTE — Progress Notes (Signed)
Pre-visit discussion using our clinic review tool. No additional management support is needed unless otherwise documented below in the visit note.  

## 2012-12-02 NOTE — Progress Notes (Signed)
Subjective:    Patient ID: Stephen Freeman, male    DOB: May 26, 1980, 32 y.o.   MRN: [id number]  Cough Associated symptoms include postnasal drip and rhinorrhea. Pertinent negatives include no chest pain, chills, fever, headaches, rash, sore throat, shortness of breath or wheezing.    Patient here today for evaluation of persistent cough.  Was seen on 11-20-12 by Dr Cleta Alberts - at that time, Medrol dose pack as well as Delsym was prescribed. He has been taking Dayquil instead of Delsym.  Pt completed dose pack and felt that his symptoms have progressively improved, but still with chronic cough.  No fevers or chills.  Positive clear/slightly yellow sputum. He does complain of post nasal drip and ear "fullness".  No prior history of allergies. There is no history of asthma as a child.   Past Medical History  Diagnosis Date  . COMMON MIGRAINE   . GERD   . OBESITY, MORBID   . Abscess of buttock, right      Review of Systems  Constitutional: Negative for fever, chills, activity change and appetite change.  HENT: Positive for congestion, postnasal drip and rhinorrhea. Negative for sinus pressure, sore throat and trouble swallowing.   Respiratory: Positive for cough. Negative for shortness of breath and wheezing.   Cardiovascular: Negative for chest pain, palpitations and leg swelling.  Gastrointestinal: Negative for nausea, vomiting, diarrhea and constipation.  Musculoskeletal: Negative for arthralgias.  Skin: Negative for rash.  Neurological: Negative for light-headedness and headaches.       Objective:   Physical Exam  Vitals reviewed. Constitutional: He is oriented to person, place, and time. He appears well-nourished. No distress.  obese  HENT:  Head: Normocephalic and atraumatic.  Right Ear: Tympanic membrane, external ear and ear canal normal.  Left Ear: Hearing and tympanic membrane normal.  Nose: Nose normal.  Mouth/Throat: Oropharynx is clear and moist. No oropharyngeal exudate.   Left ear cerumen  Eyes: Conjunctivae are normal. Pupils are equal, round, and reactive to light. Right eye exhibits no discharge. Left eye exhibits no discharge. No scleral icterus.  Neck: Normal range of motion. Neck supple. No thyromegaly present.  Cardiovascular: Normal rate, regular rhythm and normal heart sounds.   No murmur heard. Pulmonary/Chest: Effort normal. No respiratory distress. He has wheezes (R side base, end exp). He has no rales.  Lymphadenopathy:    He has no cervical adenopathy.  Neurological: He is alert and oriented to person, place, and time.  Skin: Skin is warm and dry. No rash noted. He is not diaphoretic.  Psychiatric: He has a normal mood and affect. His behavior is normal. Judgment and thought content normal.    Wt Readings from Last 3 Encounters:  12/02/12 317 lb 6.4 oz (143.972 kg)  11/20/12 316 lb (143.337 kg)  06/22/12 316 lb (143.337 kg)   BP Readings from Last 3 Encounters:  12/02/12 124/72  11/20/12 108/78  08/13/12 118/78   Lab Results  Component Value Date   WBC 6.8 11/20/2012   HGB 12.4* 11/20/2012   HCT 40.1* 11/20/2012   PLT 251.0 03/12/2011   GLUCOSE 95 03/12/2011   CHOL 176 03/12/2011   TRIG 99.0 03/12/2011   HDL 42.30 03/12/2011   LDLCALC 114* 03/12/2011   ALT 38 03/12/2011   AST 22 03/12/2011   NA 140 03/12/2011   K 4.3 03/12/2011   CL 105 03/12/2011   CREATININE 0.9 03/12/2011   BUN 14 03/12/2011   CO2 27 03/12/2011   TSH 2.24 03/12/2011  Assessment & Plan:   Cough, RAD - allergic rhinitis  symptoms improved with medrol pak last week but not resolved No evidence for infection  Tx with Claritin qd and tessalon TID + Alb MDI prn - erx done

## 2012-12-02 NOTE — Patient Instructions (Addendum)
It was good to see you today.  We have reviewed your prior records including labs and tests today  If you develop worsening symptoms or fever, call and we can reconsider antibiotics, but it does not appear necessary to use antibiotics at this time.  Take Claritin 10 mg once daily every day for next 30 days and use Tessalon Perles 3 times daily as needed for cough  Your prescription(s) have been submitted to your pharmacy. Please take as directed and contact our office if you believe you are having problem(s) with the medication(s).  Allergies Allergies may happen from anything your body is sensitive to. This may be food, medicines, pollens, chemicals, and nearly anything around you in everyday life that produces allergens. An allergen is anything that causes an allergy producing substance. Heredity is often a factor in causing these problems. This means you may have some of the same allergies as your parents. Food allergies happen in all age groups. Food allergies are some of the most severe and life threatening. Some common food allergies are cow's milk, seafood, eggs, nuts, wheat, and soybeans. SYMPTOMS   Swelling around the mouth.  An itchy red rash or hives.  Vomiting or diarrhea.  Difficulty breathing. SEVERE ALLERGIC REACTIONS ARE LIFE-THREATENING. This reaction is called anaphylaxis. It can cause the mouth and throat to swell and cause difficulty with breathing and swallowing. In severe reactions only a trace amount of food (for example, peanut oil in a salad) may cause death within seconds. Seasonal allergies occur in all age groups. These are seasonal because they usually occur during the same season every year. They may be a reaction to molds, grass pollens, or tree pollens. Other causes of problems are house dust mite allergens, pet dander, and mold spores. The symptoms often consist of nasal congestion, a runny itchy nose associated with sneezing, and tearing itchy eyes. There is  often an associated itching of the mouth and ears. The problems happen when you come in contact with pollens and other allergens. Allergens are the particles in the air that the body reacts to with an allergic reaction. This causes you to release allergic antibodies. Through a chain of events, these eventually cause you to release histamine into the blood stream. Although it is meant to be protective to the body, it is this release that causes your discomfort. This is why you were given anti-histamines to feel better. If you are unable to pinpoint the offending allergen, it may be determined by skin or blood testing. Allergies cannot be cured but can be controlled with medicine. Hay fever is a collection of all or some of the seasonal allergy problems. It may often be treated with simple over-the-counter medicine such as diphenhydramine. Take medicine as directed. Do not drink alcohol or drive while taking this medicine. Check with your caregiver or package insert for child dosages. If these medicines are not effective, there are many new medicines your caregiver can prescribe. Stronger medicine such as nasal spray, eye drops, and corticosteroids may be used if the first things you try do not work well. Other treatments such as immunotherapy or desensitizing injections can be used if all else fails. Follow up with your caregiver if problems continue. These seasonal allergies are usually not life threatening. They are generally more of a nuisance that can often be handled using medicine. HOME CARE INSTRUCTIONS   If unsure what causes a reaction, keep a diary of foods eaten and symptoms that follow. Avoid foods that cause reactions.  If hives or rash are present:  Take medicine as directed.  You may use an over-the-counter antihistamine (diphenhydramine) for hives and itching as needed.  Apply cold compresses (cloths) to the skin or take baths in cool water. Avoid hot baths or showers. Heat will make a  rash and itching worse.  If you are severely allergic:  Following a treatment for a severe reaction, hospitalization is often required for closer follow-up.  Wear a medic-alert bracelet or necklace stating the allergy.  You and your family must learn how to give adrenaline or use an anaphylaxis kit.  If you have had a severe reaction, always carry your anaphylaxis kit or EpiPen with you. Use this medicine as directed by your caregiver if a severe reaction is occurring. Failure to do so could have a fatal outcome. SEEK MEDICAL CARE IF:  You suspect a food allergy. Symptoms generally happen within 30 minutes of eating a food.  Your symptoms have not gone away within 2 days or are getting worse.  You develop new symptoms.  You want to retest yourself or your child with a food or drink you think causes an allergic reaction. Never do this if an anaphylactic reaction to that food or drink has happened before. Only do this under the care of a caregiver. SEEK IMMEDIATE MEDICAL CARE IF:   You have difficulty breathing, are wheezing, or have a tight feeling in your chest or throat.  You have a swollen mouth, or you have hives, swelling, or itching all over your body.  You have had a severe reaction that has responded to your anaphylaxis kit or an EpiPen. These reactions may return when the medicine has worn off. These reactions should be considered life threatening. MAKE SURE YOU:   Understand these instructions.  Will watch your condition.  Will get help right away if you are not doing well or get worse. Document Released: 04/09/2002 Document Revised: 05/11/2012 Document Reviewed: 09/14/2007 [facility] Patient Information 2014 [place], [place].

## 2012-12-03 ENCOUNTER — Other Ambulatory Visit: Payer: Self-pay

## 2012-12-09 ENCOUNTER — Encounter: Payer: Self-pay | Admitting: Internal Medicine

## 2012-12-09 DIAGNOSIS — R05 Cough: Secondary | ICD-10-CM

## 2012-12-09 DIAGNOSIS — R062 Wheezing: Secondary | ICD-10-CM

## 2012-12-09 DIAGNOSIS — R059 Cough, unspecified: Secondary | ICD-10-CM

## 2012-12-11 ENCOUNTER — Ambulatory Visit (INDEPENDENT_AMBULATORY_CARE_PROVIDER_SITE_OTHER): Payer: BC Managed Care – PPO | Admitting: Internal Medicine

## 2012-12-11 ENCOUNTER — Encounter: Payer: Self-pay | Admitting: Internal Medicine

## 2012-12-11 VITALS — BP 130/86 | HR 82 | Temp 98.5°F | Ht 69.5 in | Wt 317.0 lb

## 2012-12-11 DIAGNOSIS — R05 Cough: Secondary | ICD-10-CM

## 2012-12-11 MED ORDER — TRAMADOL HCL 50 MG PO TABS: ORAL_TABLET | ORAL | Status: DC

## 2012-12-11 MED ORDER — PREDNISONE (PAK) 10 MG PO TABS: ORAL_TABLET | ORAL | Status: DC

## 2012-12-11 MED ORDER — AMOXICILLIN-POT CLAVULANATE 875-125 MG PO TABS: 1.0000 | ORAL_TABLET | Freq: Two times a day (BID) | ORAL | Status: DC

## 2012-12-11 NOTE — Progress Notes (Signed)
Subjective:     Patient ID: Stephen Freeman, male   DOB: 26-Oct-1980, 32 y.o.   MRN: [id number]  HPI  77 yowm never smoker  With tendency to sinus infection since HS up to once a year with new onset cough early Oct 2014 referred to pulmonary clinic  12/11/2012 by Dr Bayard Hugger for chronic cough.  12/11/2012 1st [facility] office visit/ Brieanna Nau cc indolent onset persistent throat irritation/ clearing x 4 weeks with am cough p shower > thick yellow then rest of day feels urge to clear thorat assoc with nasal congestion. Already tried pred/ maybe helped a little - no better with clarition, saba  Tessalon helps a little, pred the best  Does not wake him up Only sob with cough.  Using lots of cough drops   No obvious day to day or daytime variabilty or assoc  cp or chest tightness, subjective wheeze overt sinus or hb symptoms. No unusual exp hx or h/o childhood pna/ asthma or knowledge of premature birth.  Sleeping ok without nocturnal  or early am exacerbation  of respiratory  c/o's or need for noct saba. Also denies any obvious fluctuation of symptoms with weather or environmental changes or other aggravating or alleviating factors except as outlined above   Current Medications, Allergies, Complete Past Medical History, Past Surgical History, Family History, and Social History were reviewed in [employer] record.  ROS  The following are not active complaints unless bolded sore throat, dysphagia, dental problems, itching, sneezing,  nasal congestion or excess/ purulent secretions, ear ache,   fever, chills, sweats, unintended wt loss, pleuritic or exertional cp, hemoptysis,  orthopnea pnd or leg swelling, presyncope, palpitations, heartburn, abdominal pain, anorexia, nausea, vomiting, diarrhea  or change in bowel or urinary habits, change in stools or urine, dysuria,hematuria,  rash, arthralgias, visual complaints, headache, numbness weakness or ataxia or problems with walking or  coordination,  change in mood/affect or memory.            Review of Systems     Objective:   Physical Exam     Wt Readings from Last 3 Encounters:  12/11/12 317 lb (143.79 kg)  12/02/12 317 lb 6.4 oz (143.972 kg)  11/20/12 316 lb (143.337 kg)    amb obese wm nad  HEENT: nl dentition, turbinates, and orophanx. Nl external ear canals without cough reflex   NECK :  without JVD/Nodes/TM/ nl carotid upstrokes bilaterally   LUNGS: no acc muscle use,  insp and exp rhonchi    CV:  RRR  no s3 or murmur or increase in P2, no edema   ABD:  soft and nontender with nl excursion in the supine position. No bruits or organomegaly, bowel sounds nl  MS:  warm without deformities, calf tenderness, cyanosis or clubbing  SKIN: warm and dry without lesions    NEURO:  alert, approp, no deficits    cxr 11/20/12 No active cardiopulmonary disease.      Assessment:

## 2012-12-11 NOTE — Patient Instructions (Signed)
Prednisone 10 mg take  4 each am x 2 days,   2 each am x 2 days,  1 each am x 2 days and stop   Augmentin 875 mg take one pill twice daily  X 10 days - take at breakfast and supper with large glass of water.  It would help reduce the usual side effects (diarrhea and yeast infections) if you ate cultured yogurt at lunch.    The key to effective treatment for your cough is eliminating the non-stop cycle of cough you're stuck in long enough to let your airway heal completely and then see if there is anything still making you cough once you stop the cough suppression, but this should take no more than 5 days to figure out  First take delsym two tsp every 12 hours and supplement if needed with  tramadol 50 mg up to 2 every 4 hours to suppress the urge to cough at all or even clear your throat. Swallowing water or using ice chips/non mint and menthol containing candies (such as lifesavers or sugarless jolly ranchers) are also effective.  You should rest your voice and avoid activities that you know make you cough.  Once you have eliminated the cough for 3 straight days try reducing the tramadol first,  then the delsym as tolerated.    Try prilosec 20mg   Take 30-60 min before first meal of the day and Pepcid 20 mg one bedtime until cough is completely gone for at least a week without the need for cough suppression  I think of reflux for chronic cough like I do oxygen for fire (doesn't cause the fire but once you get the oxygen suppressed it usually goes away regardless of the exact cause).  GERD (REFLUX)  is an extremely common cause of respiratory symptoms, many times with no significant heartburn at all.    It can be treated with medication, but also with lifestyle changes including avoidance of late meals, excessive alcohol, smoking cessation, and avoid fatty foods, chocolate, peppermint, colas, red wine, and acidic juices such as orange juice.  NO MINT OR MENTHOL PRODUCTS SO NO COUGH DROPS  USE  SUGARLESS CANDY INSTEAD (jolley ranchers or Stover's)  NO OIL BASED VITAMINS - use powdered substitutes.  We need to see you back in two weeks if not 100% better but call [facility]my[facility] Free at [phone] for sinus CT

## 2012-12-13 DIAGNOSIS — R05 Cough: Secondary | ICD-10-CM | POA: Insufficient documentation

## 2012-12-13 NOTE — Assessment & Plan Note (Signed)
The standardized cough guidelines published in Chest by [place] in 2006 are still the best available and consist of a multiple step process (up to 12!) , not a single office visit,  and are intended  to address this problem logically,  with an alogrithm dependent on response to empiric treatment at  each progressive step  to determine a specific diagnosis with  minimal addtional testing needed. Therefore if adherence is an issue or can't be accurately verified,  it's very unlikely the standard evaluation and treatment will be successful here.    Furthermore, response to therapy (other than acute cough suppression, which should only be used short term with avoidance of narcotic containing cough syrups if possible), can be a gradual process for which the patient may perceive immediate benefit.  Unlike going to an eye doctor where the best perscription is almost always the first one and is immediately effective, this is almost never the case in the management of chronic cough syndromes. Therefore the patient needs to commit up front to consistently adhere to recommendations  for up to 6 weeks of therapy directed at the likely underlying problem(s) before the response can be reasonably evaluated.   Most likely this is  Classic Upper airway cough syndrome, so named because it's frequently impossible to sort out how much is  CR/sinusitis with freq throat clearing (which can be related to primary GERD)   vs  causing  secondary (" extra esophageal")  GERD from wide swings in gastric pressure that occur with throat clearing, often  promoting self use of mint and menthol lozenges that reduce the lower esophageal sphincter tone and exacerbate the problem further in a cyclical fashion.   These are the same pts (now being labeled as having "irritable larynx syndrome" by some cough centers) who not infrequently have a history of having failed to tolerate ace inhibitors,  dry powder inhalers or biphosphonates or  report having atypical reflux symptoms that don't respond to standard doses of PPI , and are easily confused as having aecopd or asthma flares by even experienced allergists/ pulmonologists.   For now rx for gerd / sinus dz and next step is sinus ct if not better

## 2013-01-14 ENCOUNTER — Encounter: Payer: Self-pay | Admitting: Internal Medicine

## 2013-01-15 ENCOUNTER — Encounter: Payer: Self-pay | Admitting: Internal Medicine

## 2013-01-15 ENCOUNTER — Ambulatory Visit (INDEPENDENT_AMBULATORY_CARE_PROVIDER_SITE_OTHER): Payer: BC Managed Care – PPO | Admitting: Internal Medicine

## 2013-01-15 VITALS — BP 124/76 | HR 99 | Temp 97.5°F | Ht 70.5 in | Wt 326.0 lb

## 2013-01-15 DIAGNOSIS — R05 Cough: Secondary | ICD-10-CM

## 2013-01-15 MED ORDER — AMOXICILLIN-POT CLAVULANATE 875-125 MG PO TABS: 1.0000 | ORAL_TABLET | Freq: Two times a day (BID) | ORAL | Status: DC

## 2013-01-15 MED ORDER — PREDNISONE (PAK) 10 MG PO TABS: ORAL_TABLET | ORAL | Status: DC

## 2013-01-15 MED ORDER — TRAMADOL HCL 50 MG PO TABS: ORAL_TABLET | ORAL | Status: DC

## 2013-01-15 NOTE — Progress Notes (Signed)
Subjective:     Patient ID: Stephen Freeman, male   DOB: 1980-11-06    MRN: [id number]    Brief patient profile:  32 yowm never smoker  With tendency to sinus infection since HS up to once a year with new onset cough early Oct 2014 referred to pulmonary clinic  12/11/2012 by Dr Bayard Hugger for chronic cough.   History of Present Illness  12/11/2012 1st [facility] office visit/ Stephen Freeman cc indolent onset persistent throat irritation/ clearing x 4 weeks with am cough p shower > thick yellow then rest of day feels urge to clear thorat assoc with nasal congestion. Already tried pred/ maybe helped a little - no better with clarition, saba  Tessalon helps a little, pred the best  Does not wake him up Only sob with cough.  Using lots of cough drops rec Prednisone 10 mg take  4 each am x 2 days,   2 each am x 2 days,  1 each am x 2 days and stop  Augmentin 875 mg take one pill twice daily  X 10 days  First take delsym two tsp every 12 hours and supplement if needed with  tramadol 50 mg up to 2 every 4 hours to suppress the urge to cough at all or even clear your throat.  Try prilosec 20mg   Take 30-60 min before first meal of the day and Pepcid 20 mg one bedtime  GERD  diet We need to see you back in two weeks if not 100% better but call [facility] at [phone] for sinus CT    01/15/2013 f/u ov/Stephen Freeman re:  Chief Complaint  Patient presents with  . Acute Visit    Pt c/o nasal congestion and increased cough x 2 wks. Cough is prod with minimal yellow sputum. He also c/o HA and sinus pressure.   did not call for sinus ct but otherwise followed above rx well and got almost 100% better before relapsed with nasal flare first      No obvious day to day or daytime variabilty or assoc  cp or chest tightness, subjective wheeze overt   hb symptoms. No unusual exp hx or h/o childhood pna/ asthma or knowledge of premature birth.  Sleeping ok without nocturnal  or early am exacerbation  of respiratory  c/o's or need  for noct saba. Also denies any obvious fluctuation of symptoms with weather or environmental changes or other aggravating or alleviating factors except as outlined above   Current Medications, Allergies, Complete Past Medical History, Past Surgical History, Family History, and Social History were reviewed in [employer] record.  ROS  The following are not active complaints unless bolded sore throat, dysphagia, dental problems, itching, sneezing,  nasal congestion or excess/ purulent secretions, ear ache,   fever, chills, sweats, unintended wt loss, pleuritic or exertional cp, hemoptysis,  orthopnea pnd or leg swelling, presyncope, palpitations, heartburn, abdominal pain, anorexia, nausea, vomiting, diarrhea  or change in bowel or urinary habits, change in stools or urine, dysuria,hematuria,  rash, arthralgias, visual complaints, headache, numbness weakness or ataxia or problems with walking or coordination,  change in mood/affect or memory.                  Objective:   Physical Exam    01/15/13        326  Wt Readings from Last 3 Encounters:  12/11/12 317 lb (143.79 kg)  12/02/12 317 lb 6.4 oz (143.972 kg)  11/20/12 316 lb (143.337 kg)  amb obese wm nad but def nasal tone to voice   HEENT: nl dentition, turbinates, and orophanx. Nl external ear canals without cough reflex   NECK :  without JVD/Nodes/TM/ nl carotid upstrokes bilaterally   LUNGS: no acc muscle use,  insp and exp rhonchi    CV:  RRR  no s3 or murmur or increase in P2, no edema   ABD:  soft and nontender with nl excursion in the supine position. No bruits or organomegaly, bowel sounds nl  MS:  warm without deformities, calf tenderness, cyanosis or clubbing  SKIN: warm and dry without lesions    NEURO:  alert, approp, no deficits    cxr 11/20/12 No active cardiopulmonary disease.      Assessment:

## 2013-01-15 NOTE — Patient Instructions (Addendum)
Please see patient coordinator before you leave today  to schedule sinus CT   Prednisone 10 mg take  4 each am x 2 days,   2 each am x 2 days,  1 each am x 2 days and stop   Augmentin 875 mg take one pill twice daily  X 20 days - take at breakfast and supper with large glass of water.  It would help reduce the usual side effects (diarrhea and yeast infections) if you ate cultured yogurt at lunch.    The key to effective treatment for your cough is eliminating the non-stop cycle of cough you're stuck in long enough to let your airway heal completely and then see if there is anything still making you cough once you stop the cough suppression, but this should take no more than 5 days to figure out  First take delsym two tsp every 12 hours and supplement if needed with  tramadol 50 mg up to 2 every 4 hours to suppress the urge to cough at all or even clear your throat. Swallowing water or using ice chips/non mint and menthol containing candies (such as lifesavers or sugarless jolly ranchers) are also effective.  You should rest your voice and avoid activities that you know make you cough.  Once you have eliminated the cough for 3 straight days try reducing the tramadol first,  then the delsym as tolerated.    Try prilosec 20mg   Take 30-60 min before first meal of the day and Pepcid 20 mg one bedtime until cough is completely gone for at least a week without the need for cough suppression   GERD (REFLUX)  is an extremely common cause of respiratory symptoms, many times with no significant heartburn at all.    It can be treated with medication, but also with lifestyle changes including avoidance of late meals, excessive alcohol, smoking cessation, and avoid fatty foods, chocolate, peppermint, colas, red wine, and acidic juices such as orange juice.  NO MINT OR MENTHOL PRODUCTS SO NO COUGH DROPS  USE SUGARLESS CANDY INSTEAD (jolley ranchers or Stover's)  NO OIL BASED VITAMINS - use powdered  substitutes.

## 2013-01-16 ENCOUNTER — Encounter: Payer: Self-pay | Admitting: Internal Medicine

## 2013-01-16 NOTE — Assessment & Plan Note (Signed)
Still strongly favor  Classic Upper airway cough syndrome, so named because it's frequently impossible to sort out how much is  CR/sinusitis with freq throat clearing (which can be related to primary GERD)   vs  causing  secondary (" extra esophageal")  GERD from wide swings in gastric pressure that occur with throat clearing, often  promoting self use of mint and menthol lozenges that reduce the lower esophageal sphincter tone and exacerbate the problem further in a cyclical fashion.   These are the same pts (now being labeled as having "irritable larynx syndrome" by some cough centers) who not infrequently have a history of having failed to tolerate ace inhibitors,  dry powder inhalers or biphosphonates or report having atypical reflux symptoms that don't respond to standard doses of PPI , and are easily confused as having aecopd or asthma flares by even experienced allergists/ pulmonologists.   Will repeat prev cyclical cough protocol except also add CT Sinus now and if neg add 1st gen H1 per guidelines  See instructions for specific recommendations which were reviewed directly with the patient who was given a copy with highlighter outlining the key components.

## 2013-01-18 ENCOUNTER — Ambulatory Visit (INDEPENDENT_AMBULATORY_CARE_PROVIDER_SITE_OTHER)
Admission: RE | Admit: 2013-01-18 | Discharge: 2013-01-18 | Disposition: A | Discharge status: Home / Self Care | Payer: BC Managed Care – PPO | Source: Ambulatory Visit | Attending: Internal Medicine | Admitting: Internal Medicine

## 2013-01-18 ENCOUNTER — Encounter: Payer: Self-pay | Admitting: Internal Medicine

## 2013-01-18 DIAGNOSIS — R05 Cough: Secondary | ICD-10-CM

## 2013-01-18 NOTE — Progress Notes (Signed)
Quick Note:  LMTCB ______ 

## 2013-01-19 NOTE — Progress Notes (Signed)
Quick Note:  Spoke with pt and notified of results per Dr. Wert. Pt verbalized understanding and denied any questions.  ______ 

## 2013-09-16 ENCOUNTER — Telehealth: Payer: Self-pay | Admitting: *Deleted

## 2013-09-16 DIAGNOSIS — L03317 Cellulitis of buttock: Principal | ICD-10-CM

## 2013-09-16 DIAGNOSIS — L0231 Cutaneous abscess of buttock: Secondary | ICD-10-CM

## 2013-09-16 NOTE — Telephone Encounter (Signed)
Left msg on triage stating he keep having recurrent abcess. Wanting md to [facility]...Raechel Chute/lmb

## 2013-09-17 ENCOUNTER — Ambulatory Visit (INDEPENDENT_AMBULATORY_CARE_PROVIDER_SITE_OTHER): Payer: BC Managed Care – PPO | Admitting: Internal Medicine

## 2013-09-17 VITALS — BP 122/86 | HR 95 | Temp 98.3°F | Resp 16 | Ht 65.5 in | Wt 342.0 lb

## 2013-09-17 DIAGNOSIS — L03317 Cellulitis of buttock: Secondary | ICD-10-CM

## 2013-09-17 DIAGNOSIS — L0231 Cutaneous abscess of buttock: Secondary | ICD-10-CM

## 2013-09-17 MED ORDER — DOXYCYCLINE HYCLATE 100 MG PO TABS: 100.0000 mg | ORAL_TABLET | Freq: Two times a day (BID) | ORAL | Status: DC

## 2013-09-17 NOTE — Telephone Encounter (Signed)
Called pt spoke with wife gave md response.../lmb 

## 2013-09-17 NOTE — Progress Notes (Signed)
   Subjective:  This chart was scribed for Tonye Pearsonobert P Robley Matassa, MD by Bronson CurbJacqueline Melvin, ED Scribe. This patient was seen in room Room/bed 11 and the patient's care was started at 12:24 PM.   Patient ID: Stephen Freeman, male    DOB: 1980/04/01, 33 y.o.   MRN: [id number]  HPI  HPI Comments: Stephen Freeman is a 33 y.o. male who presents to the [facility] complaining of abscess to the perirectal area onset 4 days ago. Patient reports history of abscesses to the same area. There is associated minimal redness. He denies fever, chills, or drainage.  Patient is morbidly obese, and has history of migraines, GERD, and cellulitis.   Review of Systems  Constitutional: Negative for fever and chills.  Skin:       Abscess to buttock       Objective:   Physical Exam  Skin:  At 3 o'clock perirectal there is a 1 cm by 2 cm tender fluctuant area with minimal redness. This  Does not extend into the rectum. There is small nonthrombosed hemorrhoid at 4 o'clock.   3cc 2%L for local anes Opened 2cm ellipse w/ blood and pus No rectal tenderness w/ digital exam  BP 122/86  Pulse 95  Temp(Src) 98.3 F (36.8 C) (Oral)  Resp 16  Ht 5' 5.5" (1.664 m)  Wt 342 lb (155.13 kg)  BMI 56.03 kg/m2  SpO2 98%      Assessment & Plan:  I have completed the patient encounter in its entirety as documented by the scribe, with editing by me where necessary. Blue Winther P. Merla Richesoolittle, M.D.  Abscess, gluteal, right - Plan: Wound culture  Meds ordered this encounter  Medications  . doxycycline (VIBRA-TABS) 100 MG tablet    Sig: Take 1 tablet (100 mg total) by mouth 2 (two) times daily.    Dispense:  20 tablet    Refill:  0   Hot soaks 15'tid

## 2013-09-17 NOTE — Telephone Encounter (Signed)
Refer done.

## 2013-09-19 ENCOUNTER — Ambulatory Visit (INDEPENDENT_AMBULATORY_CARE_PROVIDER_SITE_OTHER): Payer: BC Managed Care – PPO | Admitting: Physician Assistant

## 2013-09-19 VITALS — BP 138/72 | HR 95 | Temp 99.1°F | Resp 16 | Ht 70.0 in | Wt 349.0 lb

## 2013-09-19 DIAGNOSIS — L0231 Cutaneous abscess of buttock: Secondary | ICD-10-CM

## 2013-09-19 DIAGNOSIS — L03317 Cellulitis of buttock: Secondary | ICD-10-CM

## 2013-09-19 NOTE — Patient Instructions (Signed)
Continue the antibiotic as prescribed. Apply a warm compress to the area for 15-20 minutes 2-4 times each day. Change the dressing if it becomes saturated, leaks or falls off.  

## 2013-09-20 LAB — WOUND CULTURE: Gram Stain: NONE SEEN

## 2013-09-20 NOTE — Progress Notes (Signed)
   Subjective:    Patient ID: Stephen Freeman, male    DOB: 02-15-1980, 33 y.o.   MRN: [id number]   PCP: Rene Paci, MD  Chief Complaint  Patient presents with  . recheck abscess    buttocks, worsening    Prior to Admission medications   Medication Sig Start Date End Date Taking? Authorizing Provider  doxycycline (VIBRA-TABS) 100 MG tablet Take 1 tablet (100 mg total) by mouth 2 (two) times daily. 09/17/13  Yes Tonye Pearson, MD    HPI  This 33 y.o. male presents for re-evaluation of buttock abscess. He was seen here for same 2 days ago and the lesion was I&D'd, but not packed.  Dr. Netta Corrigan note is reviewed.  He's had progressively worsening pain since then, and feels like the abscess has "filled up again," and become even bigger.   He's tolerating doxycycline well. No fever, chills.  He has an appointment with general surgery next week.   Review of Systems As above.    Objective:   Physical Exam  Constitutional: He is oriented to person, place, and time. He appears well-developed and well-nourished. No distress.  BP 138/72  Pulse 95  Temp(Src) 99.1 F (37.3 C) (Oral)  Resp 16  Ht  (1.778 m)  Wt 349 lb (158.305 kg)  BMI 50.08 kg/m2  SpO2 99%   Pulmonary/Chest: Effort normal.  Genitourinary:     Large abscess on the RIGHT buttock, just lateral to the rectum.  I&D site is healed.  Scant surrounding induration, but very tender.  Neurological: He is alert and oriented to person, place, and time.  Psychiatric: He has a normal mood and affect. His speech is normal and behavior is normal.   PROCEDURE: Verbal Consent Obtained. Local anesthesia with 2.5 cc of 2% lidocaine plain.  11 blade used to incise the lesion, starting in the lateral third of the original incision and extending 1.5 cm laterally.  Copious malodorous purulence, and multiple clots of blood, several 1+ cm, expressed. Irrigated wound with 2.5 cc of 2% lidocaine and packed with 1/2 inch  plain packing.  Cleansed and dressed.        Assessment & Plan:  1. Abscess, gluteal, right Continue antibiotic, warm compresses and minimize time sitting and standing.  Lie prone whenever he can. RTC here in 2 days, unless he's seen the general surgeon before then.   Fernande Bras, PA-C Physician Assistant-Certified [facility]

## 2013-09-21 ENCOUNTER — Ambulatory Visit (INDEPENDENT_AMBULATORY_CARE_PROVIDER_SITE_OTHER): Payer: BC Managed Care – PPO | Admitting: Physician Assistant

## 2013-09-21 VITALS — BP 116/72 | HR 94 | Temp 98.2°F | Resp 18 | Ht 70.0 in | Wt 347.0 lb

## 2013-09-21 DIAGNOSIS — L03317 Cellulitis of buttock: Secondary | ICD-10-CM

## 2013-09-21 DIAGNOSIS — L0231 Cutaneous abscess of buttock: Secondary | ICD-10-CM

## 2013-09-21 NOTE — Progress Notes (Signed)
Patient ID: Stephen Freeman MRN: [id number], DOB: 01/05/1981 33 y.o. Date of Encounter: 09/21/2013, 8:33 AM  Chief Complaint: Wound care   See previous note  HPI: 33 y.o. y/o male presents for wound care s/p I&D on 09/19/13.  Initially drained on 8/21 but not packed - symptoms worsened until re-opened on 8/23.  Doing well No issues or complaints Afebrile/ no chills No nausea or vomiting Tolerating doxycycline.  Pain improved significantly.  Amount of purulent drainage at home decreasing each day.  Daily dressing change Previous note reviewed  Past Medical History  Diagnosis Date  . COMMON MIGRAINE   . GERD   . OBESITY, MORBID   . Abscess of buttock, right      Home Meds: Prior to Admission medications   Medication Sig Start Date End Date Taking? Authorizing Provider  doxycycline (VIBRA-TABS) 100 MG tablet Take 1 tablet (100 mg total) by mouth 2 (two) times daily. 09/17/13  Yes Tonye Pearson, MD    Allergies: No Known Allergies  ROS: Constitutional: Afebrile, no chills Cardiovascular: negative for chest pain or palpitations Dermatological: Positive for wound. Negative for erythema, pain, or warmth.  GI: No nausea or vomiting   EXAM: Physical Exam: Blood pressure 116/72, pulse 94, temperature 98.2 F (36.8 C), temperature source Oral, resp. rate 18, height  (1.778 m), weight 347 lb (157.398 kg), SpO2 97.00%., Body mass index is 49.79 kg/(m^2). General: Well developed, well nourished, in no acute distress. Nontoxic appearing. Head: Normocephalic, atraumatic, sclera non-icteric.  Neck: Supple. Lungs: Breathing is unlabored. Heart: Normal rate. Skin:  Warm and moist. Dressing and packing in place. No induration, erythema, or tenderness to palpation. Neuro: Alert and oriented X 3. Moves all extremities spontaneously. Normal gait.  Psych:  Responds to questions appropriately with a normal affect.       PROCEDURE: Dressing and packing removed. No purulence  expressed Wound bed healthy Irrigated with 1% plain lidocaine 5 cc. Repacked with 1/4 plain packing.  Dressing applied  LAB: Culture: no significant growth  A/P: 33 y.o. y/o male with buttocks cellulitis/abscess as above s/p I&D on 09/19/13.  Wound care per above Continue doxycycline.  Pain well controlled Daily dressing changes Recheck 48 hours  Signed, Rhoderick Moody, PA-C 09/21/2013 8:33 AM

## 2014-03-26 ENCOUNTER — Encounter: Payer: Self-pay | Admitting: Family Medicine

## 2014-03-26 ENCOUNTER — Ambulatory Visit (INDEPENDENT_AMBULATORY_CARE_PROVIDER_SITE_OTHER): Payer: BC Managed Care – PPO

## 2014-03-26 ENCOUNTER — Ambulatory Visit (INDEPENDENT_AMBULATORY_CARE_PROVIDER_SITE_OTHER): Payer: BC Managed Care – PPO | Admitting: Family Medicine

## 2014-03-26 VITALS — BP 136/76 | HR 108 | Temp 100.0°F | Ht 70.0 in | Wt 360.0 lb

## 2014-03-26 DIAGNOSIS — R509 Fever, unspecified: Secondary | ICD-10-CM

## 2014-03-26 DIAGNOSIS — R5381 Other malaise: Secondary | ICD-10-CM

## 2014-03-26 DIAGNOSIS — R05 Cough: Secondary | ICD-10-CM

## 2014-03-26 DIAGNOSIS — J181 Lobar pneumonia, unspecified organism: Secondary | ICD-10-CM

## 2014-03-26 DIAGNOSIS — R059 Cough, unspecified: Secondary | ICD-10-CM

## 2014-03-26 DIAGNOSIS — J189 Pneumonia, unspecified organism: Secondary | ICD-10-CM

## 2014-03-26 LAB — POCT CBC
Granulocyte percent: 90 %G — AB (ref 37–80)
HCT, POC: 43.2 % — AB (ref 43.5–53.7)
Hemoglobin: 14.4 g/dL (ref 14.1–18.1)
LYMPH, POC: 1.1 (ref 0.6–3.4)
MCH: 29.1 pg (ref 27–31.2)
MCHC: 33.3 g/dL (ref 31.8–35.4)
MCV: 87.3 fL (ref 80–97)
MID (cbc): 0.6 (ref 0–0.9)
MPV: 6.8 fL (ref 0–99.8)
PLATELET COUNT, POC: 236 10*3/uL (ref 142–424)
POC Granulocyte: 15.2 — AB (ref 2–6.9)
POC LYMPH PERCENT: 6.3 %L — AB (ref 10–50)
POC MID %: 3.7 %M (ref 0–12)
RBC: 4.95 M/uL (ref 4.69–6.13)
RDW, POC: 14.3 %
WBC: 16.9 10*3/uL — AB (ref 4.6–10.2)

## 2014-03-26 MED ORDER — CLARITHROMYCIN 500 MG PO TABS: 500.0000 mg | ORAL_TABLET | Freq: Two times a day (BID) | ORAL | Status: DC

## 2014-03-26 MED ORDER — HYDROCODONE-HOMATROPINE 5-1.5 MG/5ML PO SYRP: 5.0000 mL | ORAL_SOLUTION | ORAL | Status: DC | PRN

## 2014-03-26 MED ORDER — BENZONATATE 100 MG PO CAPS: 100.0000 mg | ORAL_CAPSULE | Freq: Three times a day (TID) | ORAL | Status: DC | PRN

## 2014-03-26 NOTE — Progress Notes (Signed)
Subjective: 34 year old man who has been having a little coughing and allergy like symptoms over the past week. The past couple of days he has got worse. He had a rough last night with coughing and congestion and being sweaty. He has not had any documented fevers at home, but he was a little febrile when he came in here. He does not smoke. He works an Consulting civil engineerT job. He is married but no one else at home is sick. He did get a flu shot this year. He coughs up a little bit more in the morning but a little as the day goes on.  Objective: Mildly diaphoretic. TMs normal. Throat mildly erythematous without exudate. Neck supple without significant nodes. Chest is clear to percussion and auscultation. Heart regular. He does feel hot to touch in his skin is sweaty  Assessment: Cough Fever Generalized malaise  Plan: CBC and chest x-ray to make sure he does not have a pneumonia.  [facility] reading (PRIMARY) by  Dr. Alwyn RenHopper Probable left lower lobe pneumonia. The infiltrate is a little difficult to be certain of due to poor penetration of the film.  Results for orders placed or performed in visit on 03/26/14  POCT CBC  Result Value Ref Range   WBC 16.9 (A) 4.6 - 10.2 K/uL   Lymph, poc 1.1 0.6 - 3.4   POC LYMPH PERCENT 6.3 (A) 10 - 50 %L   MID (cbc) 0.6 0 - 0.9   POC MID % 3.7 0 - 12 %M   POC Granulocyte 15.2 (A) 2 - 6.9   Granulocyte percent 90.0 (A) 37 - 80 %G   RBC 4.95 4.69 - 6.13 M/uL   Hemoglobin 14.4 14.1 - 18.1 g/dL   HCT, POC 21.343.2 (A) 08.643.5 - 53.7 %   MCV 87.3 80 - 97 fL   MCH, POC 29.1 27 - 31.2 pg   MCHC 33.3 31.8 - 35.4 g/dL   RDW, POC 57.814.3 %   Platelet Count, POC 236 142 - 424 K/uL   MPV 6.8 0 - 99.8 fL   .  Diagnosis: Community-acquired pneumonia  Plan: Biaxin twice daily Hycodan Tessalon

## 2014-03-26 NOTE — Patient Instructions (Addendum)
Drink plenty of fluids and get enough rest  Tylenol or Advil if needed for fever  Biaxin 500 mg twice daily for infection  Hycodan if needed for cough  Tessalon for a nonsedating cough medication when you return to work  Return if in all worse  Pneumonia Pneumonia is an infection of the lungs.  CAUSES Pneumonia may be caused by bacteria or a virus. Usually, these infections are caused by breathing infectious particles into the lungs (respiratory tract). SIGNS AND SYMPTOMS   Cough.  Fever.  Chest pain.  Increased rate of breathing.  Wheezing.  Mucus production. DIAGNOSIS  If you have the common symptoms of pneumonia, your health care provider will typically confirm the diagnosis with a chest X-ray. The X-ray will show an abnormality in the lung (pulmonary infiltrate) if you have pneumonia. Other tests of your blood, urine, or sputum may be done to find the specific cause of your pneumonia. Your health care provider may also do tests (blood gases or pulse oximetry) to see how well your lungs are working. TREATMENT  Some forms of pneumonia may be spread to other people when you cough or sneeze. You may be asked to wear a mask before and during your exam. Pneumonia that is caused by bacteria is treated with antibiotic medicine. Pneumonia that is caused by the influenza virus may be treated with an antiviral medicine. Most other viral infections must run their course. These infections will not respond to antibiotics.  HOME CARE INSTRUCTIONS   Cough suppressants may be used if you are losing too much rest. However, coughing protects you by clearing your lungs. You should avoid using cough suppressants if you can.  Your health care provider may have prescribed medicine if he or she thinks your pneumonia is caused by bacteria or influenza. Finish your medicine even if you start to feel better.  Your health care provider may also prescribe an expectorant. This loosens the mucus to be  coughed up.  Take medicines only as directed by your health care provider.  Do not smoke. Smoking is a common cause of bronchitis and can contribute to pneumonia. If you are a smoker and continue to smoke, your cough may last several weeks after your pneumonia has cleared.  A cold steam vaporizer or humidifier in your room or home may help loosen mucus.  Coughing is often worse at night. Sleeping in a semi-upright position in a recliner or using a couple pillows under your head will help with this.  Get rest as you feel it is needed. Your body will usually let you know when you need to rest. PREVENTION A pneumococcal shot (vaccine) is available to prevent a common bacterial cause of pneumonia. This is usually suggested for:  People over 58 years old.  Patients on chemotherapy.  People with chronic lung problems, such as bronchitis or emphysema.  People with immune system problems. If you are over 65 or have a high risk condition, you may receive the pneumococcal vaccine if you have not received it before. In some countries, a routine influenza vaccine is also recommended. This vaccine can help prevent some cases of pneumonia.You may be offered the influenza vaccine as part of your care. If you smoke, it is time to quit. You may receive instructions on how to stop smoking. Your health care provider can provide medicines and counseling to help you quit. SEEK MEDICAL CARE IF: You have a fever. SEEK IMMEDIATE MEDICAL CARE IF:   Your illness becomes worse. This  is especially true if you are elderly or weakened from any other disease.  You cannot control your cough with suppressants and are losing sleep.  You begin coughing up blood.  You develop pain which is getting worse or is uncontrolled with medicines.  Any of the symptoms which initially brought you in for treatment are getting worse rather than better.  You develop shortness of breath or chest pain. MAKE SURE YOU:    Understand these instructions.  Will watch your condition.  Will get help right away if you are not doing well or get worse. Document Released: 01/14/2005 Document Revised: 05/31/2013 Document Reviewed: 04/05/2010 [facility] Patient Information 2015 [employer], [place]. This information is not intended to replace advice given to you by your health care provider. Make sure you discuss any questions you have with your health care provider.

## 2014-08-17 ENCOUNTER — Ambulatory Visit (INDEPENDENT_AMBULATORY_CARE_PROVIDER_SITE_OTHER): Payer: BC Managed Care – PPO | Admitting: Internal Medicine

## 2014-08-17 ENCOUNTER — Encounter: Payer: Self-pay | Admitting: Internal Medicine

## 2014-08-17 VITALS — BP 116/88 | HR 90 | Temp 98.5°F | Wt 363.5 lb

## 2014-08-17 DIAGNOSIS — G43809 Other migraine, not intractable, without status migrainosus: Secondary | ICD-10-CM

## 2014-08-17 MED ORDER — SUMATRIPTAN SUCCINATE 50 MG PO TABS: ORAL_TABLET | ORAL | Status: DC

## 2014-08-17 NOTE — Progress Notes (Signed)
Pre visit review using our clinic review tool, if applicable. No additional management support is needed unless otherwise documented below in the visit note. 

## 2014-08-17 NOTE — Patient Instructions (Addendum)
  Flonase 1 spray in left nostril twice a day as needed. Use the "crossover" technique as discussed.   Please keep a diary of your headaches . Document  each occurrence on the calendar with notation of : #1 any prodrome ( any non headache symptom such as marked fatigue,visual changes, ,etc ) which precedes actual headache ; #2) severity on 1-10 scale; #3) any triggers ( food/ drink,enviromenntal or weather changes ,physical or emotional stress) in 8-12 hour period prior to the headache; & #4) response to any medications or other intervention. Please review "Headache" @ [facility] for additional information.    Chronic daily headaches represent  a conversion of migraines into a headache which recurs repeatedly every day .These are almost always  due to the use of excess nonsteroidals such as ibuprofen or naproxen and or caffeine. The nonsteroidals and caffeine should be weaned as quickly as possible; but they should not be "cold [place]" going from a high dose to none.

## 2014-08-17 NOTE — Progress Notes (Signed)
   Subjective:    Patient ID: Stephen Freeman, male    DOB: [birth year], 34 y.o.   MRN: [id number]  HPI  His symptoms began 7/14 or 7/15 as a headache localized to the left paranasal area and medial portion of the eyebrow/supraorbital area. He also notices tenderness to touch in this area.  This has recurred each day midmorning to noon and last four-6 hours. It has gotten worse each day. The headache was worse 7/18 and he almost left work.  The headache is not associated with excess tearing or excess rhinitis.  He's been taking Aleve or ibuprofen as well as Excedrin. He feels Excedrin helped the most.  He does drink 1-2 sodas a day.  He has a history of migraines. Sometimes he has a prodrome described as a "pinch of light" .He's had some postnasal drainage with present headache but denies frank upper respiratory tract infection symptoms. There's been no associated photophobia or phonophobia. He's had no blurred vision or double vision.  Review of Systems Frontal headache, facial pain , nasal purulence, dental pain, sore throat , otic pain or otic discharge denied. No fever , chills or sweats.     Objective:   Physical Exam   His head is shaven. Pupils are small but reactive. Extraocular motion and field of vision are intact.  General appearance:Adequately nourished; no acute distress or increased work of breathing is present.  BMI: 52.16  Lymphatic: No  lymphadenopathy about the head, neck, or axilla .  Eyes: No conjunctival inflammation or lid edema is present. There is no scleral icterus.  Ears:  External ear exam shows no significant lesions or deformities.  Otoscopic examination reveals clear canals, tympanic membranes are intact bilaterally without bulging, retraction, inflammation or discharge.  Nose:  External nasal examination shows no deformity or inflammation. Nasal mucosa are pink and moist without lesions or exudates No septal dislocation or deviation.No obstruction to  airflow.   Oral exam: Dental hygiene is good; lips and gums are healthy appearing.There is no oropharyngeal erythema or exudate .  Neck:  No deformities, thyromegaly, masses, or tenderness noted.   Supple with full range of motion without pain.   Heart:  Normal rate and regular rhythm. S1 and S2 normal without gallop, murmur, click, rub or other extra sounds.   Lungs:Chest clear to auscultation; no wheezes, rhonchi,rales ,or rubs present.  Extremities:  No cyanosis, edema, or clubbing  noted    Skin: Warm & dry w/o tenting or jaundice. No significant lesions or rash.      Assessment & Plan:  #1 migraine variant, possible chronic daily headache. Clinically there is no evidence of sinusitis.  Plan: See orders and recommendations

## 2014-08-18 ENCOUNTER — Telehealth: Payer: Self-pay | Admitting: Internal Medicine

## 2014-08-18 NOTE — Telephone Encounter (Signed)
Patient was in yesterday to see Dr. Alwyn Ren with headaches and he is taking the medication that was prescribed and he has a pretty bad headache now.  He wants to know what to do now. Please call patient

## 2014-08-18 NOTE — Telephone Encounter (Signed)
The Imitrex can be taken with headache onset and repeated in 2 hours. It can be increased to 100 mg each dose. If that does not relieve the headache; switch to tramadol 50 mg every 6-8 hours as needed #15.  Report fever or purulent nasal discharge.

## 2014-08-18 NOTE — Telephone Encounter (Signed)
Please advise 

## 2014-08-19 DIAGNOSIS — E88819 Insulin resistance, unspecified: Secondary | ICD-10-CM | POA: Insufficient documentation

## 2014-08-19 DIAGNOSIS — R635 Abnormal weight gain: Secondary | ICD-10-CM | POA: Insufficient documentation

## 2014-08-19 NOTE — Telephone Encounter (Signed)
LVM for pt to call back.

## 2014-08-25 ENCOUNTER — Telehealth: Payer: Self-pay | Admitting: Internal Medicine

## 2014-08-25 ENCOUNTER — Other Ambulatory Visit: Payer: Self-pay | Admitting: Internal Medicine

## 2014-08-25 DIAGNOSIS — R519 Headache, unspecified: Secondary | ICD-10-CM

## 2014-08-25 DIAGNOSIS — R51 Headache: Principal | ICD-10-CM

## 2014-08-25 DIAGNOSIS — G43809 Other migraine, not intractable, without status migrainosus: Secondary | ICD-10-CM

## 2014-08-25 NOTE — Telephone Encounter (Signed)
Referral made; also sinus films ordered

## 2014-08-25 NOTE — Telephone Encounter (Signed)
LOV with Hop on 08/17/14. Is there any additional advisement for this pt.

## 2014-08-25 NOTE — Telephone Encounter (Signed)
Pt called in and head is still hurting.  Meds are not helping.  Heading is happening same time and same place every single day.  He would like a referral to Neurology  Best number [phone]

## 2014-08-25 NOTE — Telephone Encounter (Signed)
Pt informed

## 2014-08-31 ENCOUNTER — Ambulatory Visit (INDEPENDENT_AMBULATORY_CARE_PROVIDER_SITE_OTHER): Payer: BC Managed Care – PPO | Admitting: Neurology

## 2014-08-31 ENCOUNTER — Encounter: Payer: Self-pay | Admitting: Neurology

## 2014-08-31 VITALS — BP 120/84 | HR 90 | Ht 70.0 in | Wt 352.1 lb

## 2014-08-31 DIAGNOSIS — G529 Cranial nerve disorder, unspecified: Secondary | ICD-10-CM

## 2014-08-31 DIAGNOSIS — G500 Trigeminal neuralgia: Secondary | ICD-10-CM

## 2014-08-31 MED ORDER — PREDNISONE 10 MG PO TABS: ORAL_TABLET | ORAL | Status: DC

## 2014-08-31 NOTE — Patient Instructions (Addendum)
1.  Start prednisone  tablets as follows:  Take 6 tablets on the first day and reduce by one tablet each day over one week. 2.  Try to limit ibuprofen to twice per week  3.  Call or send a My Chart message with update in two weeks 4.  Return to clinic as needed

## 2014-08-31 NOTE — Progress Notes (Signed)
[facility] Neurology Division Clinic Note - Initial Visit   Date: 08/31/2014  Stephen Freeman MRN: [id number] DOB: October 18, 1980   Dear Dr. Alwyn Ren:  Thank you for your kind referral of Stephen Freeman for consultation of headaches. Although his history is well known to you, please allow [place] to reiterate it for the purpose of our medical record. The patient was accompanied to the clinic by self.   History of Present Illness: Stephen Freeman is a 34 y.o. right-handed Caucasian male with morbid obesity, GERD, and migraine presenting for evaluation of headahes.    Starting in mid-July 2016, he began experiencing left supraorbital pain, described as sharp and throbbing.  Pain is daily and starts around 11am and after taking 2-3 ibuprofen it resolves within an hour.  He is pain-free for the remainder of the day, until the following day.  It does not radiate into his eye or face.  She had some tenderness over the left eye.  He has localized tenderness over the same area to palpation  Denies any lacrimation or rhinorrhea.  Pain is not episodic, radiating, or electrical.  There is no associated numbness/tingling or vision changes.   His migraine are usually retroorbital pain and occurs rarely, about 2-3 times per year.  It usually lasts 4-6 hours and improves with rest.  He has never had to take imitrex with his.  There is family history of migraines.    Out-side paper records, electronic medical record, and images have been reviewed where available and summarized as:  Lab Results  Component Value Date   TSH 2.24 03/12/2011     Past Medical History  Diagnosis Date  . COMMON MIGRAINE   . GERD   . OBESITY, MORBID   . Abscess of buttock, right     Past Surgical History  Procedure Laterality Date  . Lasik  2005  . Eye surgery       Medications:  Outpatient Encounter Prescriptions as of 08/31/2014  Medication Sig  . SUMAtriptan (IMITREX) 50 MG tablet 1 with onset of headache; repeat in 2  hrs if needed  . [DISCONTINUED] benzonatate (TESSALON) 100 MG capsule Take 1-2 capsules (100-200 mg total) by mouth 3 (three) times daily as needed. (Patient not taking: Reported on 08/17/2014)  . [DISCONTINUED] clarithromycin (BIAXIN) 500 MG tablet Take 1 tablet (500 mg total) by mouth 2 (two) times daily. (Patient not taking: Reported on 08/17/2014)  . [DISCONTINUED] doxycycline (VIBRA-TABS) 100 MG tablet Take 1 tablet (100 mg total) by mouth 2 (two) times daily. (Patient not taking: Reported on 03/26/2014)  . [DISCONTINUED] HYDROcodone-homatropine (HYCODAN) 5-1.5 MG/5ML syrup Take 5 mLs by mouth every 4 (four) hours as needed. (Patient not taking: Reported on 08/17/2014)   No facility-administered encounter medications on file as of 08/31/2014.     Allergies: No Known Allergies  Family History: Family History  Problem Relation Age of Onset  . Diabetes Mother   . Hypertension Mother   . Alcohol abuse Father   . Stroke Father   . Arthritis Other     grandparent  . Heart disease Maternal Grandmother   . Diabetes Maternal Grandmother   . Emphysema Maternal Grandfather     smoked  . Parkinson's disease Maternal Grandfather     Social History: History  Substance Use Topics  . Smoking status: Never Smoker   . Smokeless tobacco: Never Used  . Alcohol Use: 0.0 oz/week    0 Standard drinks or equivalent per week   History   Social History Narrative  Lives with wife and daughter in a 3 story home.  Has another baby on the way.  Works as a Engineer, structural.  Education: Event organiser.    Review of Systems:  CONSTITUTIONAL: No fevers, chills, night sweats, or weight loss.   EYES: No visual changes or eye pain ENT: No hearing changes.  No history of nose bleeds.   RESPIRATORY: No cough, wheezing and shortness of breath.   CARDIOVASCULAR: Negative for chest pain, and palpitations.   GI: Negative for abdominal discomfort, blood in stools or black stools.  No recent change in bowel  habits.   GU:  No history of incontinence.   MUSCLOSKELETAL: No history of joint pain or swelling.  No myalgias.   SKIN: Negative for lesions, rash, and itching.   HEMATOLOGY/ONCOLOGY: Negative for prolonged bleeding, bruising easily, and swollen nodes.  No history of cancer.   ENDOCRINE: Negative for cold or heat intolerance, polydipsia or goiter.   PSYCH:  No depression or anxiety symptoms.   NEURO: As Above.   Vital Signs:  BP 120/84 mmHg  Pulse 90  Ht 5\' 10"  (1.778 m)  Wt 352 lb 2 oz (159.723 kg)  BMI 50.52 kg/m2  SpO2 96%   General Medical Exam:   General:  Well appearing, comfortable.   Eyes/ENT: see cranial nerve examination.   Neck: No masses appreciated.  Full range of motion without tenderness.  No carotid bruits. Respiratory:  Clear to auscultation, good air entry bilaterally.   Cardiac:  Regular rate and rhythm, no murmur.   Extremities:  No deformities, edema, or skin discoloration.  Skin:  No rashes or lesions.  Neurological Exam: MENTAL STATUS including orientation to time, place, person, recent and remote memory, attention span and concentration, language, and fund of Stephen is normal.  Speech is not dysarthric.  CRANIAL NERVES: II:  No visual field defects.  Unremarkable fundi.   III-IV-VI: Pupils equal round and reactive to light.  Normal conjugate, extra-ocular eye movements in all directions of gaze.  No nystagmus.  Subtle left ptosis.   V:  Normal facial sensation.   There is point tenderness over the left supraorbital notch where is pain is. VII:  Very mild flattening of the right nasolabial fold.  Normal facial movements.  No pathologic facial reflexes.  VIII:  Normal hearing and vestibular function.   IX-X:  Normal palatal movement.   XI:  Normal shoulder shrug and head rotation.   XII:  Normal tongue strength and range of motion, no deviation or fasciculation.  MOTOR:  No atrophy, fasciculations or abnormal movements.  No pronator drift.  Tone is  normal.    Right Upper Extremity:    Left Upper Extremity:    Deltoid  5/5   Deltoid  5/5   Biceps  5/5   Biceps  5/5   Triceps  5/5   Triceps  5/5   Wrist extensors  5/5   Wrist extensors  5/5   Wrist flexors  5/5   Wrist flexors  5/5   Finger extensors  5/5   Finger extensors  5/5   Finger flexors  5/5   Finger flexors  5/5   Dorsal interossei  5/5   Dorsal interossei  5/5   Abductor pollicis  5/5   Abductor pollicis  5/5   Tone (Ashworth scale)  0  Tone (Ashworth scale)  0   Right Lower Extremity:    Left Lower Extremity:    Hip flexors  5/5   Hip flexors  5/5   Hip extensors  5/5   Hip extensors  5/5   Knee flexors  5/5   Knee flexors  5/5   Knee extensors  5/5   Knee extensors  5/5   Dorsiflexors  5/5   Dorsiflexors  5/5   Plantarflexors  5/5   Plantarflexors  5/5   Toe extensors  5/5   Toe extensors  5/5   Toe flexors  5/5   Toe flexors  5/5   Tone (Ashworth scale)  0  Tone (Ashworth scale)  0   MSRs:  Right                                                                 Left brachioradialis 2+  brachioradialis 2+  biceps 2+  biceps 2+  triceps 2+  triceps 2+  patellar 2+  patellar 2+  ankle jerk 2+  ankle jerk 2+  Hoffman no  Hoffman no  plantar response down  plantar response down   SENSORY:  Normal and symmetric perception of light touch, pinprick, vibration, and proprioception.  Romberg's sign absent.   COORDINATION/GAIT: Normal finger-to- nose-finger and heel-to-shin.  Intact rapid alternating movements bilaterally.  Able to rise from a chair without using arms.  Gait narrow based and stable. Tandem and stressed gait intact.    IMPRESSION: Supraorbital neuralgia, left  - pain is responsive to NSAIDs but has been daily for the past two weeks  - discussed options of management including prednisone taper, nerve block, or oral preventative medication and decided to try prednisone one week taper and see if that can break the pain cycle.  Start prednisone  and  taper by  each day over one week.   - if he develops any new neurological symptoms, consider imaging  - recommended to limit ibupropfen to twice per week to prevent medication overuse headaches  He will call with update in 2 weeks and can decide whether to perform nerve block or start preventative medication    The duration of this appointment visit was 40 minutes of face-to-face time with the patient.  Greater than 50% of this time was spent in counseling, explanation of diagnosis, planning of further management, and coordination of care.   Thank you for allowing me to participate in patient's care.  If I can answer any additional questions, I would be pleased to do so.    Sincerely,    Darianny Momon K. Allena Katz, DO

## 2014-09-12 ENCOUNTER — Encounter: Payer: Self-pay | Admitting: Neurology

## 2014-09-14 NOTE — Telephone Encounter (Signed)
Occipital nerve block scheduled.

## 2014-09-19 ENCOUNTER — Ambulatory Visit (INDEPENDENT_AMBULATORY_CARE_PROVIDER_SITE_OTHER): Payer: BC Managed Care – PPO | Admitting: Neurology

## 2014-09-19 ENCOUNTER — Encounter: Payer: Self-pay | Admitting: Neurology

## 2014-09-19 VITALS — BP 110/74 | HR 74 | Ht 70.0 in | Wt 345.2 lb

## 2014-09-19 DIAGNOSIS — G500 Trigeminal neuralgia: Secondary | ICD-10-CM

## 2014-09-19 DIAGNOSIS — G529 Cranial nerve disorder, unspecified: Secondary | ICD-10-CM

## 2014-09-19 NOTE — Patient Instructions (Addendum)
If your facial pain returns, we can start carbamazepine  daily.   In the meantime, okay to continue aspirin, ibuprofen, or tylenol as needed but no more than twice per week.

## 2014-09-19 NOTE — Progress Notes (Signed)
Follow-up Visit   Date: 09/19/2014    Stephen Freeman MRN: [id number] DOB: August 08, 1980   Interim History: Stephen Freeman is a 34 y.o. right-handed Caucasian male with morbid obesity, GERD, and migraine returning to the clinic for follow-up of left supraorbital neuralgia.  The patient was accompanied to the clinic by self.  History of present illness: Starting in mid-July 2016, he began experiencing left supraorbital pain, described as sharp and throbbing. Pain is daily and starts around 11am and after taking 2-3 ibuprofen it resolves within an hour. He is pain-free for the remainder of the day, until the following day. It does not radiate into his eye or face. She had some tenderness over the left eye. He has localized tenderness over the same area to palpation Denies any lacrimation or rhinorrhea. Pain is not episodic, radiating, or electrical. There is no associated numbness/tingling or vision changes.   His migraine are usually retroorbital pain and occurs rarely, about 2-3 times per year. It usually lasts 4-6 hours and improves with rest. He has never had to take imitrex with his. There is family history of migraines.   UPDATE 09/19/2014:  He reports having resolution of his pain with prednisone taper, however within 48 hours, the pain had returned.  The intensity has improved and frequency has slightly improved.  Last week, he was having to take ibuprofen about three times per week.  He scheduled sooner visit for nerve block, but today, he is doing better and has no headache.   Medications:  Current Outpatient Prescriptions on File Prior to Visit  Medication Sig Dispense Refill  . predniSONE (DELTASONE) 10 MG tablet Take 6 tablets on the first day, then reduce by one tablet each day over one week 22 tablet 0  . SUMAtriptan (IMITREX) 50 MG tablet 1 with onset of headache; repeat in 2 hrs if needed 10 tablet 0   No current facility-administered medications on file prior to visit.     Allergies: No Known Allergies  Review of Systems:  CONSTITUTIONAL: No fevers, chills, night sweats, or weight loss.  EYES: No visual changes or eye pain ENT: No hearing changes.  No history of nose bleeds.   RESPIRATORY: No cough, wheezing and shortness of breath.   CARDIOVASCULAR: Negative for chest pain, and palpitations.   GI: Negative for abdominal discomfort, blood in stools or black stools.  No recent change in bowel habits.   GU:  No history of incontinence.   MUSCLOSKELETAL: No history of joint pain or swelling.  No myalgias.   SKIN: Negative for lesions, rash, and itching.   ENDOCRINE: Negative for cold or heat intolerance, polydipsia or goiter.   PSYCH:  No depression or anxiety symptoms.   NEURO: As Above.   Vital Signs:  BP 110/74 mmHg  Pulse 74  Ht  (1.778 m)  Wt 345 lb 4 oz (156.604 kg)  BMI 49.54 kg/m2  SpO2 99%  Neurological Exam: MENTAL STATUS including orientation to time, place, person, recent and remote memory, attention span and concentration, language, and fund of knowledge is normal.  Speech is not dysarthric.  CRANIAL NERVES:  Normal conjugate, extra-ocular eye movements in all directions of gaze.  No ptosis. Negative Tinel's over the supraorbital nerve on the left.  MOTOR:  Motor strength is 5/5 in all extremities.  COORDINATION/GAIT:  Gait narrow based and stable.    IMPRESSION/PLAN: Left supraorbital neuralgia, responsive to prednisone   - Unfortunately, pain returned after completing prednisone albeit not as severe  -  Patient would like to see how his pain evolves over the next few days, if no improvement start carbamazepine 200mg  daily  - Nerve block deferred as he is not in active pain (lidocaine 0.5cc only)  - Ok to use OTC aspirin, ibuprofen, or tylenol as needed for pain, but limit to twice per week   The duration of this appointment visit was 15 minutes of face-to-face time with the patient.  Greater than 50% of this time was  spent in counseling, explanation of diagnosis, planning of further management, and coordination of care.   Thank you for allowing me to participate in patient's care.  If I can answer any additional questions, I would be pleased to do so.    Sincerely,    Donika K. Allena Katz, DO

## 2014-10-07 ENCOUNTER — Encounter: Payer: Self-pay | Admitting: Neurology

## 2014-12-12 ENCOUNTER — Ambulatory Visit: Payer: BC Managed Care – PPO | Admitting: Neurology

## 2015-04-07 ENCOUNTER — Ambulatory Visit (INDEPENDENT_AMBULATORY_CARE_PROVIDER_SITE_OTHER): Payer: BC Managed Care – PPO | Admitting: Nurse Practitioner

## 2015-04-07 VITALS — BP 108/82 | HR 88 | Temp 98.0°F | Resp 18 | Ht 70.0 in | Wt 343.0 lb

## 2015-04-07 DIAGNOSIS — J0100 Acute maxillary sinusitis, unspecified: Secondary | ICD-10-CM | POA: Diagnosis not present

## 2015-04-07 DIAGNOSIS — R197 Diarrhea, unspecified: Secondary | ICD-10-CM | POA: Diagnosis not present

## 2015-04-07 MED ORDER — FLUTICASONE PROPIONATE 50 MCG/ACT NA SUSP: 2.0000 | Freq: Every day | NASAL | Status: DC

## 2015-04-07 MED ORDER — PROMETHAZINE-DM 6.25-15 MG/5ML PO SYRP: 5.0000 mL | ORAL_SOLUTION | Freq: Four times a day (QID) | ORAL | Status: DC | PRN

## 2015-04-07 MED ORDER — AZITHROMYCIN 250 MG PO TABS: ORAL_TABLET | ORAL | Status: DC

## 2015-04-07 NOTE — Progress Notes (Signed)
Patient ID: Stephen Freeman, male    DOB: 07/19/1980  Age: 35 y.o. MRN: [id number]  CC: No chief complaint on file.   HPI Stephen Freeman presents for CC of URI symptoms intermittent x 2 mos and diarrhea x 2 days.  1) Diarrhea- 2 days  Tuesday evening started and continued all night and through Wed. Thursday was the last day of watery diarrhea- 4 x that day Imodium is helpful  Pt has been stable on bland foods, water, and gatorade  Urine is pale yellow/clear and regular  Young son is sick contact No recent abx use  2) Pt reports 2 months of intermittent symptoms Drainage, phlegm, chills  Rhinorrhea is very thick and yellow  Cough productive- clear production  Tylenol and ibuprofen DayQuil/NyQuil- helpful  Wife was sick contact for URI symptoms  History Stephen SignsSean has a past medical history of COMMON MIGRAINE; GERD; OBESITY, MORBID; and Abscess of buttock, right.   He has past surgical history that includes LASIK (2005) and Eye surgery.   His family history includes Alcohol abuse in his father; Arthritis in his other; Diabetes in his maternal grandmother and mother; Emphysema in his maternal grandfather; Healthy in his daughter; Heart disease in his maternal grandmother; Hypertension in his mother; Migraines in his maternal grandmother; Parkinson's disease in his maternal grandfather; Stroke in his father.He reports that he has never smoked. He has never used smokeless tobacco. He reports that he drinks alcohol. He reports that he does not use illicit drugs.  Outpatient Prescriptions Prior to Visit  Medication Sig Dispense Refill  . SUMAtriptan (IMITREX) 50 MG tablet 1 with onset of headache; repeat in 2 hrs if needed (Patient not taking: Reported on 04/07/2015) 10 tablet 0   No facility-administered medications prior to visit.    ROS Review of Systems  Constitutional: Positive for fever, chills, diaphoresis and fatigue.  HENT: Positive for congestion, postnasal drip and rhinorrhea. Negative  for ear discharge, ear pain, sinus pressure, sneezing, sore throat, tinnitus, trouble swallowing and voice change.   Eyes: Negative for visual disturbance.  Respiratory: Positive for cough. Negative for chest tightness, shortness of breath and wheezing.   Cardiovascular: Negative for chest pain, palpitations and leg swelling.  Gastrointestinal: Positive for nausea and diarrhea. Negative for vomiting.  Skin: Negative for rash.  Neurological: Negative for dizziness.    Objective:  BP 108/82 mmHg  Pulse 88  Temp(Src) 98 F (36.7 C) (Oral)  Resp 18  Ht 5\' 10"  (1.778 m)  Wt 343 lb (155.584 kg)  BMI 49.22 kg/m2  SpO2 99%  Physical Exam  Constitutional: He is oriented to person, place, and time. He appears well-developed and well-nourished. No distress.  HENT:  Head: Normocephalic and atraumatic.  Right Ear: External ear normal.  Left Ear: External ear normal.  Mouth/Throat: Oropharynx is clear and moist. No oropharyngeal exudate.  TM's clear bilaterally no retraction or bulging  Eyes: Conjunctivae and EOM are normal. Pupils are equal, round, and reactive to light. Right eye exhibits no discharge. Left eye exhibits no discharge. No scleral icterus.  Neck: Normal range of motion. Neck supple. No thyromegaly present.  Cardiovascular: Normal rate and regular rhythm.   Pulmonary/Chest: Effort normal and breath sounds normal. No respiratory distress. He has no wheezes. He has no rales. He exhibits no tenderness.  Abdominal: Soft. Bowel sounds are normal. He exhibits no distension and no mass. There is no tenderness. There is no rebound and no guarding.  Obese  Lymphadenopathy:    He has no cervical  adenopathy.  Neurological: He is alert and oriented to person, place, and time.  Skin: Skin is warm and dry. No rash noted. He is not diaphoretic.  Psychiatric: He has a normal mood and affect. His behavior is normal. Judgment and thought content normal.      Assessment & Plan:   Diagnoses  and all orders for this visit:  Acute maxillary sinusitis, recurrence not specified  Diarrhea, unspecified type  Other orders -     promethazine-dextromethorphan (PROMETHAZINE-DM) 6.25-15 MG/5ML syrup; Take 5 mLs by mouth 4 (four) times daily as needed for cough. -     azithromycin (ZITHROMAX) 250 MG tablet; Take 2 tablets by mouth on day 1, take 1 tablet by mouth each day after for 4 days. -     fluticasone (FLONASE) 50 MCG/ACT nasal spray; Place 2 sprays into both nostrils daily.  I have discontinued [name] SUMAtriptan. I am also having him start on promethazine-dextromethorphan, azithromycin, and fluticasone.  Meds ordered this encounter  Medications  . promethazine-dextromethorphan (PROMETHAZINE-DM) 6.25-15 MG/5ML syrup    Sig: Take 5 mLs by mouth 4 (four) times daily as needed for cough.    Dispense:  118 mL    Refill:  0    Order Specific Question:  Supervising Provider    Answer:  Stephen Freeman, Stephen Freeman [[id number]]  . azithromycin (ZITHROMAX) 250 MG tablet    Sig: Take 2 tablets by mouth on day 1, take 1 tablet by mouth each day after for 4 days.    Dispense:  6 each    Refill:  0    Order Specific Question:  Supervising Provider    Answer:  Stephen Freeman [[id number]]  . fluticasone (FLONASE) 50 MCG/ACT nasal spray    Sig: Place 2 sprays into both nostrils daily.    Dispense:  16 g    Refill:  6    Order Specific Question:  Supervising Provider    Answer:  Stephen Freeman [[id number]]     Follow-up: Return if symptoms worsen or fail to improve.

## 2015-04-07 NOTE — Patient Instructions (Addendum)
5 mL (1 teaspoon) of cough syrup. Don't drive or make important decisions while taking this....just sleep and rest.   This will help with nausea  Please take a probiotic ( Align, Floraque or Culturelle) while you are on the antibiotic to prevent a serious antibiotic associated diarrhea  Called clostirudium dificile colitis.   Lots of water and gatorade

## 2015-04-09 ENCOUNTER — Encounter: Payer: Self-pay | Admitting: Nurse Practitioner

## 2015-04-09 DIAGNOSIS — R197 Diarrhea, unspecified: Secondary | ICD-10-CM | POA: Insufficient documentation

## 2015-04-09 DIAGNOSIS — J019 Acute sinusitis, unspecified: Secondary | ICD-10-CM | POA: Insufficient documentation

## 2015-04-09 NOTE — Assessment & Plan Note (Addendum)
New Onset Due to length of symptoms with worsening will treat empirically  Azithromycin was sent to the pharmacy Encouraged Probiotics Flonase and promethazine-DM cough syrup sent to pharmacy Continue OTC measures  FU prn worsening/failure to improve.

## 2015-04-09 NOTE — Assessment & Plan Note (Signed)
New onset Advised to continue OTC measures and hydration Promethazine-DM cough syrup sent to pharmacy for help with nausea FU prn worsening/failure to improve.

## 2015-06-16 ENCOUNTER — Encounter: Payer: BC Managed Care – PPO | Admitting: Internal Medicine

## 2015-08-03 DIAGNOSIS — J309 Allergic rhinitis, unspecified: Secondary | ICD-10-CM | POA: Insufficient documentation

## 2015-08-03 DIAGNOSIS — J324 Chronic pansinusitis: Secondary | ICD-10-CM | POA: Insufficient documentation

## 2015-08-27 NOTE — Progress Notes (Signed)
Subjective:    Patient ID: Stephen Freeman, male    DOB: 1980-08-12, 35 y.o.   MRN: [id number]  HPI He is here to establish with a new pcp.   He is here for a physical exam.   He denies changes in his health since he was here last.  He denies concerns or questions.     Medications and allergies reviewed with patient and updated if appropriate.  Patient Active Problem List   Diagnosis Date Noted  . 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 OBESITY, MORBID 06/16/2009  . COMMON MIGRAINE 06/16/2009    No current outpatient prescriptions on file prior to visit.   No current facility-administered medications on file prior to visit.     Past Medical History:  Diagnosis Date  . Abscess of buttock, right   . COMMON MIGRAINE   . GERD   . OBESITY, MORBID     Past Surgical History:  Procedure Laterality Date  . EYE SURGERY    . LASIK  2005    Social History   Social History  . Marital status: Married    Spouse name: N/A  . Number of children: N/A  . Years of education: N/A   Occupational History  . [employer]   Social History Main Topics  . Smoking status: Never Smoker  . Smokeless tobacco: Never Used  . Alcohol use 0.0 oz/week     Comment: Rarely  . Drug use: No  . Sexual activity: Not Currently   Other Topics Concern  . None   Social History Narrative   Lives with wife and 2 children     Works as a Engineer, structural.     Education: Event organiser.      Intermittent exercise    Family History  Problem Relation Age of Onset  . Diabetes Mother   . Hypertension Mother   . Alcohol abuse Father   . Stroke Father   . Heart disease Maternal Grandmother   . Diabetes Maternal Grandmother   . Migraines Maternal Grandmother   . Emphysema Maternal Grandfather     smoked  . Parkinson's disease Maternal Grandfather   . Arthritis Other     grandparent  . Healthy Daughter     Review of Systems  Constitutional: Negative for appetite change, chills, fatigue  and fever.  Eyes: Negative for visual disturbance.  Respiratory: Negative for cough, shortness of breath and wheezing.   Cardiovascular: Negative for chest pain, palpitations and leg swelling.  Gastrointestinal: Negative for abdominal pain, blood in stool, constipation, diarrhea and nausea.       Rare gerd  Endocrine: Negative for polydipsia and polyuria.  Genitourinary: Negative for dysuria and hematuria.  Musculoskeletal: Positive for arthralgias (knees - intermittent, left elbow). Negative for back pain.  Skin: Negative for color change and rash.  Neurological: Positive for headaches (migraines). Negative for dizziness and light-headedness.  Psychiatric/Behavioral: Negative for dysphoric mood. The patient is not nervous/anxious.        Objective:   Vitals:   08/28/15 1300  BP: 134/78  Pulse: 94  Resp: 16  Temp: 98.3 F (36.8 C)   Filed Weights   08/28/15 1300  Weight: (!) 362 lb (164.2 kg)   Body mass index is 51.94 kg/m.   Physical Exam Constitutional: He appears well-developed and well-nourished. No distress.  HENT:  Head: Normocephalic and atraumatic.  Right Ear: External ear normal.  Left Ear: External ear normal.  Mouth/Throat: Oropharynx is clear and moist.  Normal ear  canals and TM b/l  Eyes: Conjunctivae and EOM are normal.  Neck: Neck supple. No tracheal deviation present. No thyromegaly present.  No carotid bruit  Cardiovascular: Normal rate, regular rhythm, normal heart sounds and intact distal pulses.   No murmur heard. Pulmonary/Chest: Effort normal and breath sounds normal. No respiratory distress. He has no wheezes. He has no rales.  Abdominal: Soft. Obese. Bowel sounds are normal. He exhibits no distension. There is no tenderness.  Genitourinary: deferred  Musculoskeletal: He exhibits no edema.  Lymphadenopathy:    He has no cervical adenopathy.  Skin: Skin is warm and dry. He is not diaphoretic.  Psychiatric: He has a normal mood and affect. His  behavior is normal.         Assessment & Plan:   Physical exam: Screening blood work   ordered Immunizations  Up to date  Exercise - not regular , stressed regular exercise Weight - stressed weight loss Skin  - none, will see dermatologist Substance abuse - none  See Problem List for Assessment and Plan of chronic medical problems.

## 2015-08-27 NOTE — Patient Instructions (Addendum)
Test(s) ordered today. Your results will be released to MyChart (or called to you) after review, usually within 72hours after test completion. If any changes need to be made, you will be notified at that same time.  All other Health Maintenance issues reviewed.   All recommended immunizations and age-appropriate screenings are up-to-date or discussed.  No immunizations administered today.   Medications reviewed and updated.  No changes recommended at this time.    Please followup in one year  Health Maintenance, Male A healthy lifestyle and preventative care can promote health and wellness.  Maintain regular health, dental, and eye exams.  Eat a healthy diet. Foods like vegetables, fruits, whole grains, low-fat dairy products, and lean protein foods contain the nutrients you need and are low in calories. Decrease your intake of foods high in solid fats, added sugars, and salt. Get information about a proper diet from your health care provider, if necessary.  Regular physical exercise is one of the most important things you can do for your health. Most adults should get at least 150 minutes of moderate-intensity exercise (any activity that increases your heart rate and causes you to sweat) each week. In addition, most adults need muscle-strengthening exercises on 2 or more days a week.   Maintain a healthy weight. The body mass index (BMI) is a screening tool to identify possible weight problems. It provides an estimate of body fat based on height and weight. Your health care provider can find your BMI and can help you achieve or maintain a healthy weight. For males 20 years and older:  A BMI below 18.5 is considered underweight.  A BMI of 18.5 to 24.9 is normal.  A BMI of 25 to 29.9 is considered overweight.  A BMI of 30 and above is considered obese.  Maintain normal blood lipids and cholesterol by exercising and minimizing your intake of saturated fat. Eat a balanced diet with  plenty of fruits and vegetables. Blood tests for lipids and cholesterol should begin at age 20 and be repeated every 5 years. If your lipid or cholesterol levels are high, you are over age 50, or you are at high risk for heart disease, you may need your cholesterol levels checked more frequently.Ongoing high lipid and cholesterol levels should be treated with medicines if diet and exercise are not working.  If you smoke, find out from your health care provider how to quit. If you do not use tobacco, do not start.  Lung cancer screening is recommended for adults aged 55-80 years who are at high risk for developing lung cancer because of a history of smoking. A yearly low-dose CT scan of the lungs is recommended for people who have at least a 30-pack-year history of smoking and are current smokers or have quit within the past 15 years. A pack year of smoking is smoking an average of 1 pack of cigarettes a day for 1 year (for example, a 30-pack-year history of smoking could mean smoking 1 pack a day for 30 years or 2 packs a day for 15 years). Yearly screening should continue until the smoker has stopped smoking for at least 15 years. Yearly screening should be stopped for people who develop a health problem that would prevent them from having lung cancer treatment.  If you choose to drink alcohol, do not have more than 2 drinks per day. One drink is considered to be 12 oz (360 mL) of beer, 5 oz (150 mL) of wine, or 1.5 oz (45   mL) of liquor.  Avoid the use of street drugs. Do not share needles with anyone. Ask for help if you need support or instructions about stopping the use of drugs.  High blood pressure causes heart disease and increases the risk of stroke. High blood pressure is more likely to develop in:  People who have blood pressure in the end of the normal range (100-139/85-89 mm Hg).  People who are overweight or obese.  People who are African American.  If you are 18-39 years of age, have  your blood pressure checked every 3-5 years. If you are 40 years of age or older, have your blood pressure checked every year. You should have your blood pressure measured twice--once when you are at a hospital or clinic, and once when you are not at a hospital or clinic. Record the average of the two measurements. To check your blood pressure when you are not at a hospital or clinic, you can use:  An automated blood pressure machine at a pharmacy.  A home blood pressure monitor.  If you are 45-79 years old, ask your health care provider if you should take aspirin to prevent heart disease.  Diabetes screening involves taking a blood sample to check your fasting blood sugar level. This should be done once every 3 years after age 45 if you are at a normal weight and without risk factors for diabetes. Testing should be considered at a younger age or be carried out more frequently if you are overweight and have at least 1 risk factor for diabetes.  Colorectal cancer can be detected and often prevented. Most routine colorectal cancer screening begins at the age of 50 and continues through age 75. However, your health care provider may recommend screening at an earlier age if you have risk factors for colon cancer. On a yearly basis, your health care provider may provide home test kits to check for hidden blood in the stool. A small camera at the end of a tube may be used to directly examine the colon (sigmoidoscopy or colonoscopy) to detect the earliest forms of colorectal cancer. Talk to your health care provider about this at age 50 when routine screening begins. A direct exam of the colon should be repeated every 5-10 years through age 75, unless early forms of precancerous polyps or small growths are found.  People who are at an increased risk for hepatitis B should be screened for this virus. You are considered at high risk for hepatitis B if:  You were born in a country where hepatitis B occurs often.  Talk with your health care provider about which countries are considered high risk.  Your parents were born in a high-risk country and you have not received a shot to protect against hepatitis B (hepatitis B vaccine).  You have HIV or AIDS.  You use needles to inject street drugs.  You live with, or have sex with, someone who has hepatitis B.  You are a man who has sex with other men (MSM).  You get hemodialysis treatment.  You take certain medicines for conditions like cancer, organ transplantation, and autoimmune conditions.  Hepatitis C blood testing is recommended for all people born from 1945 through 1965 and any individual with known risk factors for hepatitis C.  Healthy men should no longer receive prostate-specific antigen (PSA) blood tests as part of routine cancer screening. Talk to your health care provider about prostate cancer screening.  Testicular cancer screening is not recommended for adolescents   or adult males who have no symptoms. Screening includes self-exam, a health care provider exam, and other screening tests. Consult with your health care provider about any symptoms you have or any concerns you have about testicular cancer.  Practice safe sex. Use condoms and avoid high-risk sexual practices to reduce the spread of sexually transmitted infections (STIs).  You should be screened for STIs, including gonorrhea and chlamydia if:  You are sexually active and are younger than 24 years.  You are older than 24 years, and your health care provider tells you that you are at risk for this type of infection.  Your sexual activity has changed since you were last screened, and you are at an increased risk for chlamydia or gonorrhea. Ask your health care provider if you are at risk.  If you are at risk of being infected with HIV, it is recommended that you take a prescription medicine daily to prevent HIV infection. This is called pre-exposure prophylaxis (PrEP). You are  considered at risk if:  You are a man who has sex with other men (MSM).  You are a heterosexual man who is sexually active with multiple partners.  You take drugs by injection.  You are sexually active with a partner who has HIV.  Talk with your health care provider about whether you are at high risk of being infected with HIV. If you choose to begin PrEP, you should first be tested for HIV. You should then be tested every 3 months for as long as you are taking PrEP.  Use sunscreen. Apply sunscreen liberally and repeatedly throughout the day. You should seek shade when your shadow is shorter than you. Protect yourself by wearing long sleeves, pants, a wide-brimmed hat, and sunglasses year round whenever you are outdoors.  Tell your health care provider of new moles or changes in moles, especially if there is a change in shape or color. Also, tell your health care provider if a mole is larger than the size of a pencil eraser.  A one-time screening for abdominal aortic aneurysm (AAA) and surgical repair of large AAAs by ultrasound is recommended for men aged 65-75 years who are current or former smokers.  Stay current with your vaccines (immunizations).   This information is not intended to replace advice given to you by your health care provider. Make sure you discuss any questions you have with your health care provider.   Document Released: 07/13/2007 Document Revised: 02/04/2014 Document Reviewed: 06/11/2010 Elsevier Interactive Patient Education 2016 Elsevier Inc.  

## 2015-08-28 ENCOUNTER — Ambulatory Visit (INDEPENDENT_AMBULATORY_CARE_PROVIDER_SITE_OTHER): Payer: BC Managed Care – PPO | Admitting: Internal Medicine

## 2015-08-28 ENCOUNTER — Encounter: Payer: Self-pay | Admitting: Internal Medicine

## 2015-08-28 VITALS — BP 134/78 | HR 94 | Temp 98.3°F | Resp 16 | Ht 70.0 in | Wt 362.0 lb

## 2015-08-28 DIAGNOSIS — Z0000 Encounter for general adult medical examination without abnormal findings: Secondary | ICD-10-CM

## 2015-08-28 MED ORDER — CETIRIZINE HCL 10 MG PO TABS: 10.0000 mg | ORAL_TABLET | Freq: Every day | ORAL | 11 refills | Status: DC

## 2015-08-28 NOTE — Assessment & Plan Note (Signed)
Stressed weight loss, regular exercise, decreased portions

## 2015-08-28 NOTE — Progress Notes (Signed)
Pre visit review using our clinic review tool, if applicable. No additional management support is needed unless otherwise documented below in the visit note. 

## 2015-09-15 ENCOUNTER — Encounter (HOSPITAL_COMMUNITY): Payer: Self-pay

## 2015-09-15 ENCOUNTER — Emergency Department (HOSPITAL_COMMUNITY): Payer: BC Managed Care – PPO

## 2015-09-15 ENCOUNTER — Other Ambulatory Visit: Payer: BC Managed Care – PPO

## 2015-09-15 ENCOUNTER — Encounter: Payer: Self-pay | Admitting: Nurse Practitioner

## 2015-09-15 ENCOUNTER — Ambulatory Visit (INDEPENDENT_AMBULATORY_CARE_PROVIDER_SITE_OTHER)
Admission: RE | Admit: 2015-09-15 | Discharge: 2015-09-15 | Disposition: A | Discharge status: Home / Self Care | Payer: BC Managed Care – PPO | Source: Ambulatory Visit | Attending: Nurse Practitioner | Admitting: Nurse Practitioner

## 2015-09-15 ENCOUNTER — Ambulatory Visit (INDEPENDENT_AMBULATORY_CARE_PROVIDER_SITE_OTHER): Payer: BC Managed Care – PPO | Admitting: Nurse Practitioner

## 2015-09-15 ENCOUNTER — Emergency Department (HOSPITAL_COMMUNITY)
Admission: EM | Admit: 2015-09-15 | Discharge: 2015-09-15 | Disposition: A | Discharge status: Home / Self Care | Payer: BC Managed Care – PPO | Attending: Emergency Medicine | Admitting: Emergency Medicine

## 2015-09-15 VITALS — BP 124/84 | HR 104 | Temp 99.4°F | Ht 70.0 in | Wt 355.0 lb

## 2015-09-15 DIAGNOSIS — R05 Cough: Secondary | ICD-10-CM

## 2015-09-15 DIAGNOSIS — K8020 Calculus of gallbladder without cholecystitis without obstruction: Secondary | ICD-10-CM | POA: Diagnosis not present

## 2015-09-15 DIAGNOSIS — R509 Fever, unspecified: Secondary | ICD-10-CM | POA: Diagnosis not present

## 2015-09-15 DIAGNOSIS — Z791 Long term (current) use of non-steroidal anti-inflammatories (NSAID): Secondary | ICD-10-CM | POA: Diagnosis not present

## 2015-09-15 DIAGNOSIS — J209 Acute bronchitis, unspecified: Secondary | ICD-10-CM

## 2015-09-15 DIAGNOSIS — R071 Chest pain on breathing: Secondary | ICD-10-CM | POA: Diagnosis present

## 2015-09-15 DIAGNOSIS — R0781 Pleurodynia: Secondary | ICD-10-CM

## 2015-09-15 DIAGNOSIS — R059 Cough, unspecified: Secondary | ICD-10-CM

## 2015-09-15 LAB — D-DIMER, QUANTITATIVE: D-Dimer, Quant: 0.64 mcg/mL FEU — ABNORMAL HIGH (ref <0.50)

## 2015-09-15 LAB — CBC
HCT: 46.2 % (ref 39.0–52.0)
Hemoglobin: 15.5 g/dL (ref 13.0–17.0)
MCH: 29.1 pg (ref 26.0–34.0)
MCHC: 33.5 g/dL (ref 30.0–36.0)
MCV: 86.8 fL (ref 78.0–100.0)
Platelets: 220 10*3/uL (ref 150–400)
RBC: 5.32 MIL/uL (ref 4.22–5.81)
RDW: 13.4 % (ref 11.5–15.5)
WBC: 6.4 10*3/uL (ref 4.0–10.5)

## 2015-09-15 LAB — BASIC METABOLIC PANEL
Anion gap: 7 (ref 5–15)
BUN: 18 mg/dL (ref 6–20)
CO2: 27 mmol/L (ref 22–32)
Calcium: 9.7 mg/dL (ref 8.9–10.3)
Chloride: 107 mmol/L (ref 101–111)
Creatinine, Ser: 1.2 mg/dL (ref 0.61–1.24)
GFR calc Af Amer: >60 mL/min (ref >60)
GFR calc non Af Amer: >60 mL/min (ref >60)
Glucose, Bld: 98 mg/dL (ref 65–99)
Potassium: 3.9 mmol/L (ref 3.5–5.1)
Sodium: 141 mmol/L (ref 135–145)

## 2015-09-15 LAB — I-STAT TROPONIN, ED: Troponin i, poc: 0.01 ng/mL (ref 0.00–0.08)

## 2015-09-15 MED ORDER — DOXYCYCLINE HYCLATE 100 MG PO TABS: 100.0000 mg | ORAL_TABLET | Freq: Two times a day (BID) | ORAL | 0 refills | Status: AC

## 2015-09-15 MED ORDER — IOPAMIDOL (ISOVUE-370) INJECTION 76%
100.0000 mL | Freq: Once | INTRAVENOUS | Status: AC | PRN
  Administered 2015-09-15: 100 mL via INTRAVENOUS

## 2015-09-15 MED ORDER — KETOROLAC TROMETHAMINE 15 MG/ML IJ SOLN
15.0000 mg | Freq: Once | INTRAMUSCULAR | Status: AC
  Administered 2015-09-15: 15 mg via INTRAVENOUS
  Filled 2015-09-15: qty 1

## 2015-09-15 MED ORDER — MELOXICAM 7.5 MG PO TABS: 7.5000 mg | ORAL_TABLET | Freq: Every day | ORAL | 0 refills | Status: DC | PRN

## 2015-09-15 MED ORDER — GUAIFENESIN ER 600 MG PO TB12: 600.0000 mg | ORAL_TABLET | Freq: Two times a day (BID) | ORAL | 0 refills | Status: DC | PRN

## 2015-09-15 MED ORDER — ALBUTEROL SULFATE HFA 108 (90 BASE) MCG/ACT IN AERS: 2.0000 | INHALATION_SPRAY | Freq: Four times a day (QID) | RESPIRATORY_TRACT | 0 refills | Status: DC | PRN

## 2015-09-15 NOTE — Progress Notes (Signed)
Pre visit review using our clinic review tool, if applicable. No additional management support is needed unless otherwise documented below in the visit note. 

## 2015-09-15 NOTE — Patient Instructions (Addendum)
CXR normal Oral abx prescribed to treat empirically, mobic prescribed for pain (take with food). Pending D dimer level Go to hospital if symptoms worsen.

## 2015-09-15 NOTE — Progress Notes (Signed)
Subjective:  Patient ID: Stephen Freeman, male    DOB: 1980/04/30  Age: 35 y.o. MRN: [id number]  CC: Chest Pain (with deep inspiration); Fever (low grade); and Cough (x 4 days)   Chest Pain   This is a new problem. The current episode started in the past 7 days. The onset quality is sudden. The problem occurs constantly. The problem has been unchanged. The pain is present in the substernal region. The pain is at a severity of 3/10. The pain is mild. The quality of the pain is described as burning and tightness. The pain does not radiate. Associated symptoms include a cough, a fever, malaise/fatigue and shortness of breath. Pertinent negatives include no diaphoresis, dizziness, exertional chest pressure, headaches, irregular heartbeat, leg pain, lower extremity edema, nausea, orthopnea, palpitations or PND. It is unknown what precipitates the cough. The cough is paroxysmal and non-productive. Nothing relieves the cough. Nothing worsens the cough. The pain is aggravated by breathing, coughing and deep breathing. He has tried NSAIDs for the symptoms. The treatment provided mild relief. Risk factors include male gender and obesity.  Fever   Associated symptoms include chest pain, congestion and coughing. Pertinent negatives include no headaches, nausea, sore throat or wheezing.  Cough  Associated symptoms include chest pain, a fever, postnasal drip, rhinorrhea and shortness of breath. Pertinent negatives include no headaches, sore throat or wheezing.    Outpatient Medications Prior to Visit  Medication Sig Dispense Refill  . cetirizine (ZYRTEC) 10 MG tablet Take 1 tablet (10 mg total) by mouth daily. 30 tablet 11   No facility-administered medications prior to visit.     ROS Review of Systems  Constitutional: Positive for fever and malaise/fatigue. Negative for diaphoresis.  HENT: Positive for congestion, postnasal drip and rhinorrhea. Negative for sinus pressure and sore throat.   Respiratory:  Positive for cough and shortness of breath. Negative for wheezing.   Cardiovascular: Positive for chest pain. Negative for palpitations, orthopnea, leg swelling and PND.  Gastrointestinal: Negative for nausea.  Neurological: Negative for dizziness and headaches.    Objective:  BP 124/84 (BP Location: Left Arm, Patient Position: Sitting, Cuff Size: Large)   Pulse (!) 104   Temp 99.4 F (37.4 C) (Oral)   Ht 5\' 10"  (1.778 m)   Wt (!) 355 lb (161 kg)   SpO2 97%   BMI 50.94 kg/m   BP Readings from Last 3 Encounters:  09/15/15 124/84  08/28/15 134/78  04/07/15 108/82    Wt Readings from Last 3 Encounters:  09/15/15 (!) 355 lb (161 kg)  08/28/15 (!) 362 lb (164.2 kg)  04/07/15 (!) 343 lb (155.6 kg)    Physical Exam  Constitutional: He is oriented to person, place, and time. He appears well-developed. No distress.  HENT:  Nose: Mucosal edema and rhinorrhea present. Right sinus exhibits no maxillary sinus tenderness and no frontal sinus tenderness. Left sinus exhibits no maxillary sinus tenderness and no frontal sinus tenderness.  Mouth/Throat: Uvula is midline. Posterior oropharyngeal erythema present. No oropharyngeal exudate.  Eyes: No scleral icterus.  Neck: Normal range of motion. Neck supple.  Cardiovascular: Normal rate and normal heart sounds.   Pulmonary/Chest: Effort normal and breath sounds normal. No stridor. No respiratory distress. He exhibits no tenderness.  Musculoskeletal: Normal range of motion. He exhibits no edema.  Lymphadenopathy:    He has no cervical adenopathy.  Neurological: He is alert and oriented to person, place, and time.  Skin: Skin is warm and dry. He is not diaphoretic.  Vitals reviewed.   Lab Results  Component Value Date   WBC 16.9 (A) 03/26/2014   HGB 14.4 03/26/2014   HCT 43.2 (A) 03/26/2014   PLT 251.0 03/12/2011   GLUCOSE 95 03/12/2011   CHOL 176 03/12/2011   TRIG 99.0 03/12/2011   HDL 42.30 03/12/2011   LDLCALC 114 (H) 03/12/2011    ALT 38 03/12/2011   AST 22 03/12/2011   NA 140 03/12/2011   K 4.3 03/12/2011   CL 105 03/12/2011   CREATININE 0.9 03/12/2011   BUN 14 03/12/2011   CO2 27 03/12/2011   TSH 2.24 03/12/2011   PE clinical Probability score is 4.5 (moderate risk), pending D-dimer.  No results found.  Assessment & Plan:  Even though patient has moderate risk for PE, he is clinically stable. Will wait for D dimer results. If positive, will refer patient to hospital for further evaluation. Differential diagnoses and management discussed with patient. He verbalized understanding.  Stephen Freeman was seen today for chest pain, fever and cough.  Diagnoses and all orders for this visit:  Acute bronchitis, unspecified organism -     DG Chest 2 View; Future -     D-Dimer, Quantitative; Future -     doxycycline (VIBRA-TABS) 100 MG tablet; Take 1 tablet (100 mg total) by mouth 2 (two) times daily. -     meloxicam (MOBIC) 7.5 MG tablet; Take 1 tablet (7.5 mg total) by mouth daily as needed for pain (with food). -     guaiFENesin (MUCINEX) 600 MG 12 hr tablet; Take 1 tablet (600 mg total) by mouth 2 (two) times daily as needed for cough or to loosen phlegm. -     albuterol (PROVENTIL HFA;VENTOLIN HFA) 108 (90 Base) MCG/ACT inhaler; Inhale 2 puffs into the lungs every 6 (six) hours as needed for wheezing or shortness of breath.  Fever, unspecified -     DG Chest 2 View; Future -     D-Dimer, Quantitative; Future -     doxycycline (VIBRA-TABS) 100 MG tablet; Take 1 tablet (100 mg total) by mouth 2 (two) times daily. -     meloxicam (MOBIC) 7.5 MG tablet; Take 1 tablet (7.5 mg total) by mouth daily as needed for pain (with food). -     guaiFENesin (MUCINEX) 600 MG 12 hr tablet; Take 1 tablet (600 mg total) by mouth 2 (two) times daily as needed for cough or to loosen phlegm.  Cough -     D-Dimer, Quantitative; Future -     doxycycline (VIBRA-TABS) 100 MG tablet; Take 1 tablet (100 mg total) by mouth 2 (two) times  daily. -     meloxicam (MOBIC) 7.5 MG tablet; Take 1 tablet (7.5 mg total) by mouth daily as needed for pain (with food). -     guaiFENesin (MUCINEX) 600 MG 12 hr tablet; Take 1 tablet (600 mg total) by mouth 2 (two) times daily as needed for cough or to loosen phlegm. -     albuterol (PROVENTIL HFA;VENTOLIN HFA) 108 (90 Base) MCG/ACT inhaler; Inhale 2 puffs into the lungs every 6 (six) hours as needed for wheezing or shortness of breath.   I am having Mr. Lucky RathkeDevlin start on doxycycline, meloxicam, guaiFENesin, and albuterol. I am also having him maintain his cetirizine.  Meds ordered this encounter  Medications  . doxycycline (VIBRA-TABS) 100 MG tablet    Sig: Take 1 tablet (100 mg total) by mouth 2 (two) times daily.    Dispense:  14 tablet  Refill:  0    Order Specific Question:   Supervising Provider    Answer:   Tresa GarterPLOTNIKOV, ALEKSEI V [[id number]]  . meloxicam (MOBIC) 7.5 MG tablet    Sig: Take 1 tablet (7.5 mg total) by mouth daily as needed for pain (with food).    Dispense:  14 tablet    Refill:  0    Order Specific Question:   Supervising Provider    Answer:   Tresa GarterPLOTNIKOV, ALEKSEI V [[id number]]  . guaiFENesin (MUCINEX) 600 MG 12 hr tablet    Sig: Take 1 tablet (600 mg total) by mouth 2 (two) times daily as needed for cough or to loosen phlegm.    Dispense:  14 tablet    Refill:  0    Order Specific Question:   Supervising Provider    Answer:   Tresa GarterPLOTNIKOV, ALEKSEI V [[id number]]  . albuterol (PROVENTIL HFA;VENTOLIN HFA) 108 (90 Base) MCG/ACT inhaler    Sig: Inhale 2 puffs into the lungs every 6 (six) hours as needed for wheezing or shortness of breath.    Dispense:  1 Inhaler    Refill:  0    Order Specific Question:   Supervising Provider    Answer:   Tresa GarterPLOTNIKOV, ALEKSEI V [[id number]]     Follow-up: Return if symptoms worsen or fail to improve.  Alysia Pennaharlotte Lovelyn Sheeran, NP

## 2015-09-15 NOTE — ED Notes (Signed)
Pt to imaging

## 2015-09-15 NOTE — ED Provider Notes (Signed)
7:25 PM CT scan shows no obvious pathology for chest pain or shortness of breath. Patient states he's had a slight cough. No pneumonia. He does have cholelithiasis on his CT but he has no abdominal pain at this time. I don't think this is the cause. Plan for discharge home, recommend NSAIDs and follow-up with his PCP. Discussed return precautions.  Results for orders placed or performed during the hospital encounter of 09/15/15  Basic metabolic panel  Result Value Ref Range   Sodium 141 135 - 145 mmol/L   Potassium 3.9 3.5 - 5.1 mmol/L   Chloride 107 101 - 111 mmol/L   CO2 27 22 - 32 mmol/L   Glucose, Bld 98 65 - 99 mg/dL   BUN 18 6 - 20 mg/dL   Creatinine, Ser 1.611.20 0.61 - 1.24 mg/dL   Calcium 9.7 8.9 - 09.610.3 mg/dL   GFR calc non Af Amer >60 >60 mL/min   GFR calc Af Amer >60 >60 mL/min   Anion gap 7 5 - 15  CBC  Result Value Ref Range   WBC 6.4 4.0 - 10.5 K/uL   RBC 5.32 4.22 - 5.81 MIL/uL   Hemoglobin 15.5 13.0 - 17.0 g/dL   HCT 04.546.2 40.939.0 - 81.152.0 %   MCV 86.8 78.0 - 100.0 fL   MCH 29.1 26.0 - 34.0 pg   MCHC 33.5 30.0 - 36.0 g/dL   RDW 91.413.4 78.211.5 - 95.615.5 %   Platelets 220 150 - 400 K/uL  I-stat troponin, ED  Result Value Ref Range   Troponin i, poc 0.01 0.00 - 0.08 ng/mL   Comment 3           Dg Chest 2 View  Result Date: 09/15/2015 CLINICAL DATA:  Three-day history of chest pain with deep inspiration EXAM: CHEST  2 VIEW COMPARISON:  March 26, 2014 FINDINGS: Lungs are clear. Heart size and pulmonary vascularity are normal. No adenopathy. No pneumothorax. No bone lesions. IMPRESSION: No edema or consolidation. Electronically Signed   By: Bretta BangWilliam  Woodruff III M.D.   On: 09/15/2015 09:26   Ct Angio Chest Pe W And/or Wo Contrast  Result Date: 09/15/2015 CLINICAL DATA:  Initial evaluation for right-sided pleuritic chest pain. Elevated D-dimer. EXAM: CT ANGIOGRAPHY CHEST WITH CONTRAST TECHNIQUE: Multidetector CT imaging of the chest was performed using the standard protocol during  bolus administration of intravenous contrast. Multiplanar CT image reconstructions and MIPs were obtained to evaluate the vascular anatomy. CONTRAST:  100 cc of Isovue 370. COMPARISON:  Prior radiograph from earlier the same day. FINDINGS: Thyroid gland normal. No pathologically enlarged mediastinal, hilar, or axillary lymph nodes identified. Intrathoracic aorta of normal caliber and appearance without acute abnormality. Visualized great vessels within normal limits. Heart size normal.  No pericardial effusion. Pulmonary arterial tree adequately opacified for evaluation. Main pulmonary artery within normal limits for size measuring 2.2 cm in diameter. No convincing filling defects are seen to suggest acute pulmonary embolism. Re-formatted imaging confirms these findings. Lungs are clear without focal infiltrate. No pulmonary edema or pleural effusion. No pneumothorax. No worrisome pulmonary nodule or mass. Hepatic steatosis noted. Multiple calcified stones present within the gallbladder lumen. Remainder of the partially visualized upper abdomen is otherwise unremarkable. No acute osseous abnormality. No worrisome lytic or blastic osseous lesions. Review of the MIP images confirms the above findings. IMPRESSION: 1. No CT evidence for acute pulmonary embolism. 2. No other acute cardiopulmonary abnormality identified. 3. Cholelithiasis. 4. Hepatic steatosis. Electronically Signed   By: Janell QuietBenjamin  McClintock M.D.  On: 09/15/2015 19:12      Pricilla LovelessScott Arvon Schreiner, MD 09/15/15 1927

## 2015-09-15 NOTE — ED Triage Notes (Signed)
PT C/O MID CHEST PAIN WHEN BREATHING SINCE TUE. PT WAS SEEN BY HIS PCP TODAY, AND DX WITH BRONCHITIS, BUT WAS TOLD TO COME TO THE ER DUE TO AN ABNORMAL LAB VALUE. PT STS CP STARTED ON THE RIGHT SIDE GOING TO HIS BACK, BUT SUBSIDES WITH IBUPROFEN.

## 2015-09-15 NOTE — ED Provider Notes (Signed)
[facility]-EMERGENCY DEPT Provider Note   CSN: [id number] Arrival date & time: 09/15/15  1351     History   Chief Complaint Chief Complaint  Patient presents with  . Chest Pain    SINCE TUE  . Abnormal Lab    HPI Stephen Freeman is a 35 y.o. male.  HPI   35 year old male with chest pain. Right-sided. Sharp in nature. Intermittent since 2 days ago. Worse with deep breathing. Does not feel short of breath. No cough. Subjective fever. No unusual leg pain or swelling. Evaluated by his PCP for the same complaint. He is referred to the emergency room after he had an elevated d-dimer. Chest and a past history of thrombosis, recent surgery, immobilization, malignancy.   Past Medical History:  Diagnosis Date  . Abscess of buttock, right   . COMMON MIGRAINE   . GERD   . OBESITY, MORBID     Patient Active Problem List   Diagnosis Date Noted  . Pain of anterior chest wall with respiration 09/15/2015  . 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 OBESITY, MORBID 06/16/2009  . COMMON MIGRAINE 06/16/2009    Past Surgical History:  Procedure Laterality Date  . EYE SURGERY    . LASIK  2005       Home Medications    Prior to Admission medications   Medication Sig Start Date End Date Taking? Authorizing Provider  albuterol (PROVENTIL HFA;VENTOLIN HFA) 108 (90 Base) MCG/ACT inhaler Inhale 2 puffs into the lungs every 6 (six) hours as needed for wheezing or shortness of breath. 09/15/15  Yes Bonna Gainsharlotte Lum Nche, NP  cetirizine (ZYRTEC) 10 MG tablet Take 1 tablet (10 mg total) by mouth daily. 08/28/15  Yes Pincus SanesStacy J Burns, MD  doxycycline (VIBRA-TABS) 100 MG tablet Take 1 tablet (100 mg total) by mouth 2 (two) times daily. 09/15/15 09/22/15 Yes Bonna Gainsharlotte Lum Nche, NP  guaiFENesin (MUCINEX) 600 MG 12 hr tablet Take 1 tablet (600 mg total) by mouth 2 (two) times daily as needed for cough or to loosen phlegm. 09/15/15  Yes Bonna Gainsharlotte Lum Nche, NP  meloxicam (MOBIC) 15 MG tablet Take 7.5 mg by mouth daily as  needed for pain.   Yes Historical Provider, MD  meloxicam (MOBIC) 7.5 MG tablet Take 1 tablet (7.5 mg total) by mouth daily as needed for pain (with food). Patient not taking: Reported on 09/15/2015 09/15/15   Anne Ngharlotte Lum Nche, NP    Family History Family History  Problem Relation Age of Onset  . Diabetes Mother   . Hypertension Mother   . Alcohol abuse Father   . Stroke Father   . Heart disease Maternal Grandmother   . Diabetes Maternal Grandmother   . Migraines Maternal Grandmother   . Emphysema Maternal Grandfather     smoked  . Parkinson's disease Maternal Grandfather   . Arthritis Other     grandparent  . Healthy Daughter     Social History Social History  Substance Use Topics  . Smoking status: Never Smoker  . Smokeless tobacco: Never Used  . Alcohol use 0.0 oz/week     Comment: Rarely     Allergies   Review of patient's allergies indicates no known allergies.   Review of Systems Review of Systems  All systems reviewed and negative, other than as noted in HPI.   Physical Exam Updated Vital Signs BP 133/85 (BP Location: Left Arm)   Pulse 117   Temp 99.2 F (37.3 C) (Oral)   Resp 23   Ht 5\' 10"  (1.778  m)   Wt (!) 348 lb (157.9 kg)   SpO2 95%   BMI 49.93 kg/m   Physical Exam  Constitutional: He appears well-developed and well-nourished.  Laying in bed. No acute distress. Obese.  HENT:  Head: Normocephalic and atraumatic.  Eyes: Conjunctivae are normal.  Neck: Neck supple.  Cardiovascular: Normal rate and regular rhythm.   No murmur heard. Mildly tachycardic.  Pulmonary/Chest: Effort normal and breath sounds normal. No respiratory distress.  Abdominal: Soft. There is no tenderness.  Musculoskeletal: He exhibits no edema.  Lower extremities symmetric as compared to each other. No calf tenderness. Negative Homan's. No palpable cords.   Neurological: He is alert.  Skin: Skin is warm and dry.  Psychiatric: He has a normal mood and affect.    Nursing note and vitals reviewed.    ED Treatments / Results  Labs (all labs ordered are listed, but only abnormal results are displayed) Labs Reviewed  BASIC METABOLIC PANEL  CBC  I-STAT TROPOININ, ED    EKG  EKG Interpretation  Date/Time:  Friday September 15 2015 14:13:35 EDT Ventricular Rate:  114 PR Interval:    QRS Duration: 96 QT Interval:  351 QTC Calculation: 484 R Axis:   37 Text Interpretation:  Sinus tachycardia Low voltage, precordial leads Borderline T wave abnormalities Borderline prolonged QT interval Confirmed by Juleen ChinaKOHUT  MD, Jamichael Knotts ([id number]) on 09/15/2015 4:39:46 PM       Radiology Dg Chest 2 View  Result Date: 09/15/2015 CLINICAL DATA:  Three-day history of chest pain with deep inspiration EXAM: CHEST  2 VIEW COMPARISON:  March 26, 2014 FINDINGS: Lungs are clear. Heart size and pulmonary vascularity are normal. No adenopathy. No pneumothorax. No bone lesions. IMPRESSION: No edema or consolidation. Electronically Signed   By: Bretta BangWilliam  Woodruff III M.D.   On: 09/15/2015 09:26    Procedures Procedures (including critical care time)  Medications Ordered in ED Medications - No data to display   Initial Impression / Assessment and Plan / ED Course  I have reviewed the triage vital signs and the nursing notes.  Pertinent labs & imaging results that were available during my care of the patient were reviewed by me and considered in my medical decision making (see chart for details).  Clinical Course    35 year old male with pleuritic right-sided chest pain. Mildly elevated d-dimer. Will obtain CT angiography. Doubt ACS, dissection, etc.   Final Clinical Impressions(s) / ED Diagnoses   Final diagnoses:  Pleuritic chest pain  Calculus of gallbladder without cholecystitis without obstruction    New Prescriptions New Prescriptions   No medications on file     Raeford RazorStephen Shamarcus Hoheisel, MD 09/17/15 1135

## 2016-03-07 ENCOUNTER — Encounter: Payer: Self-pay | Admitting: Adult Health

## 2016-03-07 ENCOUNTER — Ambulatory Visit (INDEPENDENT_AMBULATORY_CARE_PROVIDER_SITE_OTHER): Payer: BC Managed Care – PPO | Admitting: Adult Health

## 2016-03-07 VITALS — BP 124/68 | Temp 98.5°F | Wt 366.4 lb

## 2016-03-07 DIAGNOSIS — J0100 Acute maxillary sinusitis, unspecified: Secondary | ICD-10-CM | POA: Diagnosis not present

## 2016-03-07 MED ORDER — DOXYCYCLINE HYCLATE 100 MG PO CAPS: 100.0000 mg | ORAL_CAPSULE | Freq: Two times a day (BID) | ORAL | 0 refills | Status: DC

## 2016-03-07 MED ORDER — HYDROCODONE-HOMATROPINE 5-1.5 MG/5ML PO SYRP: 5.0000 mL | ORAL_SOLUTION | Freq: Four times a day (QID) | ORAL | 0 refills | Status: DC | PRN

## 2016-03-07 NOTE — Progress Notes (Signed)
Subjective:    Patient ID: Stephen BibleSean Masi, male    DOB: [birth year], 36 y.o.   MRN: [id number]  Sinusitis  This is a new problem. The current episode started in the past 7 days. There has been no fever. Associated symptoms include congestion, coughing, headaches and sinus pressure. Pertinent negatives include no chills, diaphoresis, ear pain, shortness of breath or sore throat. Past treatments include acetaminophen (Mucinex). The treatment provided mild relief.      Review of Systems  Constitutional: Positive for fatigue. Negative for chills, diaphoresis and fever.  HENT: Positive for congestion, postnasal drip, rhinorrhea, sinus pain and sinus pressure. Negative for ear pain and sore throat.   Respiratory: Positive for cough. Negative for chest tightness, shortness of breath and wheezing.   Neurological: Positive for headaches.  Psychiatric/Behavioral: Positive for sleep disturbance (from cough ).   Past Medical History:  Diagnosis Date  . Abscess of buttock, right   . COMMON MIGRAINE   . GERD   . OBESITY, MORBID     Social History   Social History  . Marital status: Married    Spouse name: N/A  . Number of children: N/A  . Years of education: N/A   Occupational History  . Systems [employer]   Social History Main Topics  . Smoking status: Never Smoker  . Smokeless tobacco: Never Used  . Alcohol use 0.0 oz/week     Comment: Rarely  . Drug use: No  . Sexual activity: Not Currently   Other Topics Concern  . Not on file   Social History Narrative   Lives with wife and 2 children     Works as a Engineer, structuralsystems engineer.     Education: Event organisermasters degree.      Intermittent exercise    Past Surgical History:  Procedure Laterality Date  . EYE SURGERY    . LASIK  2005    Family History  Problem Relation Age of Onset  . Diabetes Mother   . Hypertension Mother   . Alcohol abuse Father   . Stroke Father   . Heart disease Maternal Grandmother   . Diabetes Maternal  Grandmother   . Migraines Maternal Grandmother   . Emphysema Maternal Grandfather     smoked  . Parkinson's disease Maternal Grandfather   . Arthritis Other     grandparent  . Healthy Daughter     No Known Allergies  Current Outpatient Prescriptions on File Prior to Visit  Medication Sig Dispense Refill  . cetirizine (ZYRTEC) 10 MG tablet Take 1 tablet (10 mg total) by mouth daily. (Patient not taking: Reported on 03/07/2016) 30 tablet 11  . guaiFENesin (MUCINEX) 600 MG 12 hr tablet Take 1 tablet (600 mg total) by mouth 2 (two) times daily as needed for cough or to loosen phlegm. (Patient not taking: Reported on 03/07/2016) 14 tablet 0   No current facility-administered medications on file prior to visit.     BP 124/68 (BP Location: Left Arm, Patient Position: Sitting, Cuff Size: Large)   Temp 98.5 F (36.9 C) (Oral)   Wt (!) 366 lb 6.4 oz (166.2 kg)   BMI 52.57 kg/m       Objective:   Physical Exam  Constitutional: He is oriented to person, place, and time. He appears well-developed and well-nourished. No distress.  HENT:  Head: Normocephalic and atraumatic.  Right Ear: Hearing, external ear and ear canal normal. Tympanic membrane is not erythematous and not bulging.  Left Ear: Hearing, external ear and ear  canal normal. Tympanic membrane is bulging. Tympanic membrane is not erythematous.  Nose: Mucosal edema and rhinorrhea present. Right sinus exhibits maxillary sinus tenderness. Right sinus exhibits no frontal sinus tenderness. Left sinus exhibits maxillary sinus tenderness. Left sinus exhibits no frontal sinus tenderness.  Mouth/Throat: Uvula is midline. Oropharyngeal exudate present.  Eyes: Conjunctivae and EOM are normal. Pupils are equal, round, and reactive to light. Right eye exhibits no discharge. Left eye exhibits no discharge.  Cardiovascular: Normal rate, regular rhythm, normal heart sounds and intact distal pulses.  Exam reveals no gallop and no friction rub.   No  murmur heard. Pulmonary/Chest: Effort normal and breath sounds normal. No respiratory distress. He has no wheezes. He has no rales. He exhibits no tenderness.  Neurological: He is alert and oriented to person, place, and time.  Skin: Skin is warm and dry. No rash noted. He is not diaphoretic. No erythema. No pallor.  Psychiatric: He has a normal mood and affect. His behavior is normal. Judgment and thought content normal.  Nursing note and vitals reviewed.     Assessment & Plan:   1. Acute non-recurrent maxillary sinusitis - doxycycline (VIBRAMYCIN) 100 MG capsule; Take 1 capsule (100 mg total) by mouth 2 (two) times daily.  Dispense: 14 capsule; Refill: 0 - HYDROcodone-homatropine (HYCODAN) 5-1.5 MG/5ML syrup; Take 5 mLs by mouth every 6 (six) hours as needed for cough.  Dispense: 120 mL; Refill: 0 - Add Flonase - continue with mucinex - Follow up if no improvement in the next 2-3 days   Shirline Frees, NP

## 2017-01-15 ENCOUNTER — Ambulatory Visit: Payer: BC Managed Care – PPO | Admitting: Family Medicine

## 2017-01-15 ENCOUNTER — Encounter: Payer: Self-pay | Admitting: Family Medicine

## 2017-01-15 VITALS — BP 134/82 | HR 94 | Temp 98.3°F | Wt 370.2 lb

## 2017-01-15 DIAGNOSIS — J0100 Acute maxillary sinusitis, unspecified: Secondary | ICD-10-CM | POA: Diagnosis not present

## 2017-01-15 MED ORDER — AMOXICILLIN-POT CLAVULANATE 875-125 MG PO TABS: 1.0000 | ORAL_TABLET | Freq: Two times a day (BID) | ORAL | 0 refills | Status: DC

## 2017-01-15 MED ORDER — PREDNISONE 5 MG PO TABS: 5.0000 mg | ORAL_TABLET | Freq: Every day | ORAL | 0 refills | Status: DC

## 2017-01-15 NOTE — Progress Notes (Signed)
Stephen BibleSean Freeman is a 36 y.o. male here for an acute visit.  History of Present Illness:   HPI:   Sinus Pain Patient complains of congestion, foul rhinorrhea and sinus pressure. Onset of symptoms was 2 weeks ago. Symptoms have been unchanged since that time. He is drinking plenty of fluids.  Past history is significant for nothing. Patient is non-smoker.  PMHx, SurgHx, SocialHx, Medications, and Allergies were reviewed in the Visit Navigator and updated as appropriate.  Current Medications:   .  cetirizine (ZYRTEC) 10 MG tablet, Take 1 tablet (10 mg total) by mouth daily. (Patient not taking: Reported on 03/07/2016), Disp: 30 tablet, Rfl: 11 .  doxycycline (VIBRAMYCIN) 100 MG capsule, Take 1 capsule (100 mg total) by mouth 2 (two) times daily. (Patient not taking: Reported on 01/15/2017), Disp: 14 capsule, Rfl: 0 .  guaiFENesin (MUCINEX) 600 MG 12 hr tablet, Take 1 tablet (600 mg total) by mouth 2 (two) times daily as needed for cough or to loosen phlegm. (Patient not taking: Reported on 03/07/2016), Disp: 14 tablet, Rfl: 0 .  HYDROcodone-homatropine (HYCODAN) 5-1.5 MG/5ML syrup, Take 5 mLs by mouth every 6 (six) hours as needed for cough. (Patient not taking: Reported on 01/15/2017), Disp: 120 mL, Rfl: 0   No Known Allergies   Review of Systems:   Pertinent items are noted in the HPI. Otherwise, ROS is negative.  Vitals:   Vitals:   01/15/17 1336  BP: 134/82  Pulse: 94  Temp: 98.3 F (36.8 C)  TempSrc: Oral  SpO2: 97%  Weight: (!) 370 lb 3.2 oz (167.9 kg)     Body mass index is 53.12 kg/m.   Physical Exam:   Physical Exam  Constitutional: He is oriented to person, place, and time. He appears well-developed and well-nourished. No distress.  HENT:  Head: Normocephalic and atraumatic.  Right Ear: External ear normal.  Left Ear: External ear normal.  Nose: Mucosal edema present. Right sinus exhibits maxillary sinus tenderness and frontal sinus tenderness. Left sinus exhibits  maxillary sinus tenderness and frontal sinus tenderness.  Mouth/Throat: Oropharynx is clear and moist.  Eyes: Conjunctivae and EOM are normal. Pupils are equal, round, and reactive to light.  Neck: Normal range of motion. Neck supple.  Cardiovascular: Normal rate, regular rhythm, normal heart sounds and intact distal pulses.  Pulmonary/Chest: Effort normal and breath sounds normal.  Abdominal: Soft. Bowel sounds are normal.  Musculoskeletal: Normal range of motion.  Neurological: He is alert and oriented to person, place, and time.  Skin: Skin is warm and dry.  Psychiatric: He has a normal mood and affect. His behavior is normal. Judgment and thought content normal.  Nursing note and vitals reviewed.   Assessment and Plan:   Stephen Freeman was seen today for uri.  Diagnoses and all orders for this visit:  Subacute maxillary sinusitis -     amoxicillin-clavulanate (AUGMENTIN) 875-125 MG tablet; Take 1 tablet by mouth 2 (two) times daily. -     predniSONE (DELTASONE) 5 MG tablet; Take 1 tablet (5 mg total) by mouth daily with breakfast. 6-5-4-3-2-1-off    . Reviewed expectations re: course of current medical issues. . Discussed self-management of symptoms. . Outlined signs and symptoms indicating need for more acute intervention. . Patient verbalized understanding and all questions were answered. Marland Kitchen. Health Maintenance issues including appropriate healthy diet, exercise, and smoking avoidance were discussed with patient. . See orders for this visit as documented in the electronic medical record. . Patient received an After Visit Summary.  Helane RimaErica Adabelle Griffiths, DO [place], [facility] 01/18/2017

## 2017-01-18 ENCOUNTER — Encounter: Payer: Self-pay | Admitting: Family Medicine

## 2017-02-24 ENCOUNTER — Encounter: Payer: Self-pay | Admitting: Internal Medicine

## 2017-02-24 ENCOUNTER — Ambulatory Visit: Payer: BC Managed Care – PPO | Admitting: Internal Medicine

## 2017-02-24 VITALS — BP 136/86 | HR 90 | Temp 98.6°F | Resp 16 | Wt 364.0 lb

## 2017-02-24 DIAGNOSIS — J0100 Acute maxillary sinusitis, unspecified: Secondary | ICD-10-CM | POA: Diagnosis not present

## 2017-02-24 MED ORDER — SULFAMETHOXAZOLE-TRIMETHOPRIM 800-160 MG PO TABS: 1.0000 | ORAL_TABLET | Freq: Two times a day (BID) | ORAL | 0 refills | Status: DC

## 2017-02-24 NOTE — Progress Notes (Signed)
Subjective:    Patient ID: Stephen BibleSean Ferraris, male    DOB: Feb 17, 1980, 37 y.o.   MRN: [id number]  HPI He is here for an acute visit for cold symptoms.  His symptoms started 2 weeks ago  He is experiencing decreased appetite, nasal congestion, PND, sinus pain, sore throat, hoarseness, productive cough, and headaches.  He denies fever, SOB and wheeze.   He has tried taking sudafed, dayquil, advil   Medications and allergies reviewed with patient and updated if appropriate.  Patient Active Problem List   Diagnosis Date Noted  . Pain of anterior chest wall with respiration 09/15/2015  . 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 OBESITY, MORBID 06/16/2009  . COMMON MIGRAINE 06/16/2009    No current outpatient medications on file prior to visit.   No current facility-administered medications on file prior to visit.     Past Medical History:  Diagnosis Date  . Abscess of buttock, right   . COMMON MIGRAINE   . GERD   . OBESITY, MORBID     Past Surgical History:  Procedure Laterality Date  . EYE SURGERY    . LASIK  2005    Social History   Socioeconomic History  . Marital status: Married    Spouse name: None  . Number of children: None  . Years of education: None  . Highest education level: None  Social Needs  . Financial resource strain: None  . Food insecurity - worry: None  . Food insecurity - inability: None  . Transportation needs - medical: None  . Transportation needs - non-medical: None  Occupational History  . Occupation: Systems Admin    Employer: [employer]  Tobacco Use  . Smoking status: Never Smoker  . Smokeless tobacco: Never Used  Substance and Sexual Activity  . Alcohol use: Yes    Alcohol/week: 0.0 oz    Comment: Rarely  . Drug use: No  . Sexual activity: Not Currently  Other Topics Concern  . None  Social History Narrative   Lives with wife and 2 children     Works as a Engineer, structuralsystems engineer.     Education: Event organisermasters degree.      Intermittent exercise     Family History  Problem Relation Age of Onset  . Diabetes Mother   . Hypertension Mother   . Alcohol abuse Father   . Stroke Father   . Heart disease Maternal Grandmother   . Diabetes Maternal Grandmother   . Migraines Maternal Grandmother   . Emphysema Maternal Grandfather        smoked  . Parkinson's disease Maternal Grandfather   . Arthritis Other        grandparent  . Healthy Daughter     Review of Systems  Constitutional: Positive for appetite change (decreased intermittent). Negative for chills and fever.  HENT: Positive for congestion, postnasal drip, sinus pain, sore throat and voice change. Negative for ear pain.   Respiratory: Positive for cough (productive at times). Negative for shortness of breath and wheezing.   Gastrointestinal: Negative for diarrhea and nausea.  Musculoskeletal: Negative for myalgias.  Neurological: Positive for headaches (sinus). Negative for dizziness and light-headedness.       Objective:   Vitals:   02/24/17 1036  BP: 136/86  Pulse: 90  Resp: 16  Temp: 98.6 F (37 C)  SpO2: 98%   Filed Weights   02/24/17 1036  Weight: (!) 364 lb (165.1 kg)   Body mass index is 52.23 kg/m.  Wt Readings from Last  3 Encounters:  02/24/17 (!) 364 lb (165.1 kg)  01/15/17 (!) 370 lb 3.2 oz (167.9 kg)  03/07/16 (!) 366 lb 6.4 oz (166.2 kg)     Physical Exam GENERAL APPEARANCE: Appears stated age, well appearing, NAD EYES: conjunctiva clear, no icterus HEENT: bilateral tympanic membranes and ear canals normal, oropharynx with moderate erythema, no thyromegaly, trachea midline, no cervical or supraclavicular lymphadenopathy LUNGS: Clear to auscultation without wheeze or crackles, unlabored breathing, good air entry bilaterally CARDIOVASCULAR: Normal S1,S2 without murmurs, no edema SKIN: warm, dry        Assessment & Plan:   See Problem List for Assessment and Plan of chronic medical problems.

## 2017-02-24 NOTE — Patient Instructions (Signed)
Take the antibiotic as prescribed.  Continue over the counter cold medications.  Call if no improvement    Sinusitis, Adult Sinusitis is soreness and inflammation of your sinuses. Sinuses are hollow spaces in the bones around your face. Your sinuses are located:  Around your eyes.  In the middle of your forehead.  Behind your nose.  In your cheekbones.  Your sinuses and nasal passages are lined with a stringy fluid (mucus). Mucus normally drains out of your sinuses. When your nasal tissues become inflamed or swollen, the mucus can become trapped or blocked so air cannot flow through your sinuses. This allows bacteria, viruses, and funguses to grow, which leads to infection. Sinusitis can develop quickly and last for 7?10 days (acute) or for more than 12 weeks (chronic). Sinusitis often develops after a cold. What are the causes? This condition is caused by anything that creates swelling in the sinuses or stops mucus from draining, including:  Allergies.  Asthma.  Bacterial or viral infection.  Abnormally shaped bones between the nasal passages.  Nasal growths that contain mucus (nasal polyps).  Narrow sinus openings.  Pollutants, such as chemicals or irritants in the air.  A foreign object stuck in the nose.  A fungal infection. This is rare.  What increases the risk? The following factors may make you more likely to develop this condition:  Having allergies or asthma.  Having had a recent cold or respiratory tract infection.  Having structural deformities or blockages in your nose or sinuses.  Having a weak immune system.  Doing a lot of swimming or diving.  Overusing nasal sprays.  Smoking.  What are the signs or symptoms? The main symptoms of this condition are pain and a feeling of pressure around the affected sinuses. Other symptoms include:  Upper toothache.  Earache.  Headache.  Bad breath.  Decreased sense of smell and taste.  A cough that  may get worse at night.  Fatigue.  Fever.  Thick drainage from your nose. The drainage is often green and it may contain pus (purulent).  Stuffy nose or congestion.  Postnasal drip. This is when extra mucus collects in the throat or back of the nose.  Swelling and warmth over the affected sinuses.  Sore throat.  Sensitivity to light.  How is this diagnosed? This condition is diagnosed based on symptoms, a medical history, and a physical exam. To find out if your condition is acute or chronic, your health care provider may:  Look in your nose for signs of nasal polyps.  Tap over the affected sinus to check for signs of infection.  View the inside of your sinuses using an imaging device that has a light attached (endoscope).  If your health care provider suspects that you have chronic sinusitis, you may also:  Be tested for allergies.  Have a sample of mucus taken from your nose (nasal culture) and checked for bacteria.  Have a mucus sample examined to see if your sinusitis is related to an allergy.  If your sinusitis does not respond to treatment and it lasts longer than 8 weeks, you may have an MRI or CT scan to check your sinuses. These scans also help to determine how severe your infection is. In rare cases, a bone biopsy may be done to rule out more serious types of fungal sinus disease. How is this treated? Treatment for sinusitis depends on the cause and whether your condition is chronic or acute. If a virus is causing your sinusitis,  your symptoms will go away on their own within 10 days. You may be given medicines to relieve your symptoms, including:  Topical nasal decongestants. They shrink swollen nasal passages and let mucus drain from your sinuses.  Antihistamines. These drugs block inflammation that is triggered by allergies. This can help to ease swelling in your nose and sinuses.  Topical nasal corticosteroids. These are nasal sprays that ease inflammation  and swelling in your nose and sinuses.  Nasal saline washes. These rinses can help to get rid of thick mucus in your nose.  If your condition is caused by bacteria, you will be given an antibiotic medicine. If your condition is caused by a fungus, you will be given an antifungal medicine. Surgery may be needed to correct underlying conditions, such as narrow nasal passages. Surgery may also be needed to remove polyps. Follow these instructions at home: Medicines  Take, use, or apply over-the-counter and prescription medicines only as told by your health care provider. These may include nasal sprays.  If you were prescribed an antibiotic medicine, take it as told by your health care provider. Do not stop taking the antibiotic even if you start to feel better. Hydrate and Humidify  Drink enough water to keep your urine clear or pale yellow. Staying hydrated will help to thin your mucus.  Use a cool mist humidifier to keep the humidity level in your home above 50%.  Inhale steam for 10-15 minutes, 3-4 times a day or as told by your health care provider. You can do this in the bathroom while a hot shower is running.  Limit your exposure to cool or dry air. Rest  Rest as much as possible.  Sleep with your head raised (elevated).  Make sure to get enough sleep each night. General instructions  Apply a warm, moist washcloth to your face 3-4 times a day or as told by your health care provider. This will help with discomfort.  Wash your hands often with soap and water to reduce your exposure to viruses and other germs. If soap and water are not available, use hand sanitizer.  Do not smoke. Avoid being around people who are smoking (secondhand smoke).  Keep all follow-up visits as told by your health care provider. This is important. Contact a health care provider if:  You have a fever.  Your symptoms get worse.  Your symptoms do not improve within 10 days. Get help right away  if:  You have a severe headache.  You have persistent vomiting.  You have pain or swelling around your face or eyes.  You have vision problems.  You develop confusion.  Your neck is stiff.  You have trouble breathing. This information is not intended to replace advice given to you by your health care provider. Make sure you discuss any questions you have with your health care provider. Document Released: 01/14/2005 Document Revised: 09/10/2015 Document Reviewed: 11/09/2014 Elsevier Interactive Patient Education  [employer].

## 2017-02-24 NOTE — Assessment & Plan Note (Signed)
Symptoms s/w bacterial infection Start bactrim x 7 days otc cold meds prn Rest, fluids Call if no improvement

## 2017-04-27 ENCOUNTER — Encounter: Payer: Self-pay | Admitting: Internal Medicine

## 2017-04-29 ENCOUNTER — Encounter: Payer: Self-pay | Admitting: Internal Medicine

## 2017-04-29 ENCOUNTER — Ambulatory Visit: Payer: BC Managed Care – PPO | Admitting: Internal Medicine

## 2017-04-29 DIAGNOSIS — G5622 Lesion of ulnar nerve, left upper limb: Secondary | ICD-10-CM

## 2017-04-29 DIAGNOSIS — G5620 Lesion of ulnar nerve, unspecified upper limb: Secondary | ICD-10-CM | POA: Insufficient documentation

## 2017-04-29 NOTE — Progress Notes (Signed)
   Subjective:    Patient ID: Stephen BibleSean Freeman, male    DOB: November 05, 1980, 37 y.o.   MRN: [id number]  HPI The patient is a 37 YO man coming in for left arm pain and numbness. Was in a car ride for about 26 hours and rested his arm a certain way. In that spot he is having some numbness and pain. Started about 5 days ago and is overall stable since onset. Has not tried anything for it. Denies history of similar problem. Denies swelling in the elbow. Mild soreness in the elbow which is gradually improving. Denies change in grip strength and no limitation of movement.   Review of Systems  Constitutional: Negative for activity change, appetite change, fatigue, fever and unexpected weight change.  Respiratory: Negative.   Cardiovascular: Negative.   Musculoskeletal: Positive for myalgias. Negative for arthralgias, back pain and gait problem.  Skin: Negative.   Neurological: Positive for numbness. Negative for dizziness, syncope, facial asymmetry and weakness.      Objective:   Physical Exam  Constitutional: He is oriented to person, place, and time. He appears well-developed and well-nourished.  HENT:  Head: Normocephalic and atraumatic.  Eyes: EOM are normal.  Neck: Normal range of motion.  Cardiovascular: Normal rate.  Pulmonary/Chest: Effort normal.  Abdominal: Soft.  Musculoskeletal: He exhibits no edema or tenderness.  No real tenderness or swelling at the elbow  Neurological: He is alert and oriented to person, place, and time. Coordination normal.  Sensation intact but impaired in the ulnar region 4-5th fingers, some tenderness with palpation of the ulnar nerve at the elbow  Skin: Skin is warm and dry.  Psychiatric: He has a normal mood and affect.   Vitals:   04/29/17 0921  BP: 136/90  Pulse: 71  Temp: 98.2 F (36.8 C)  TempSrc: Oral  SpO2: 98%  Weight: (!) 362 lb (164.2 kg)  Height: 5\' 10"  (1.778 m)      Assessment & Plan:

## 2017-04-29 NOTE — Patient Instructions (Signed)
We will send in the ibuprofen to take daily for 1 week.   This should go away within 2 weeks.

## 2017-04-29 NOTE — Assessment & Plan Note (Signed)
At the elbow temporary from car travel. Advised ibuprofen daily for 1 week and ice the elbow. If any worsening call back and if no improvement in several weeks should come back with sports medicine for ultrasound evaluation.

## 2017-08-22 IMAGING — CT CT ANGIO CHEST
2 of 6 series · 18 of 36 positions shown · IV contrast (ISOVUE 370)
Comparison: Prior radiograph from earlier the same day.

CLINICAL DATA: Initial evaluation for right-sided pleuritic chest
pain. Elevated D-dimer.

EXAM:
CT ANGIOGRAPHY CHEST WITH CONTRAST
TECHNIQUE: Multidetector CT imaging of the chest was performed using the
standard protocol during bolus administration of intravenous
contrast. Multiplanar CT image reconstructions and MIPs were
obtained to evaluate the vascular anatomy.
CONTRAST:  100 cc of Isovue 370.

[Series 5: coronal mpr · coronal · 0.55mm/px · 1 of 155 slices shown]
[im 78/155  mediastinal]
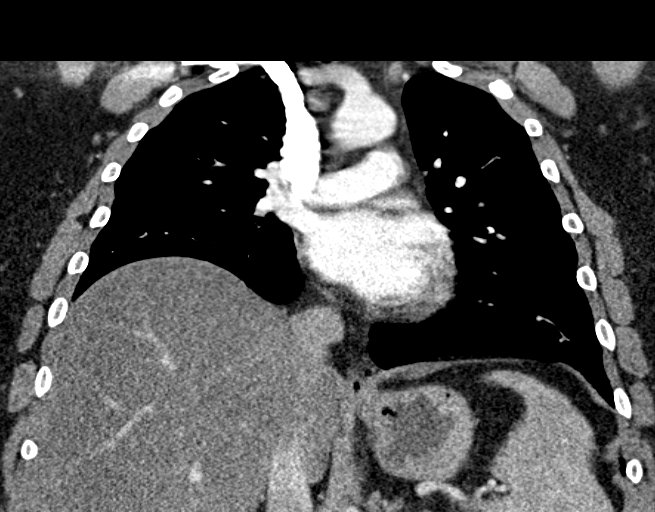

[Series 10: thins for pacs · axial · 0.74mm/px · z∈[-325,-71]mm · 17 of 284 slices shown]
[im 15/284  lung]
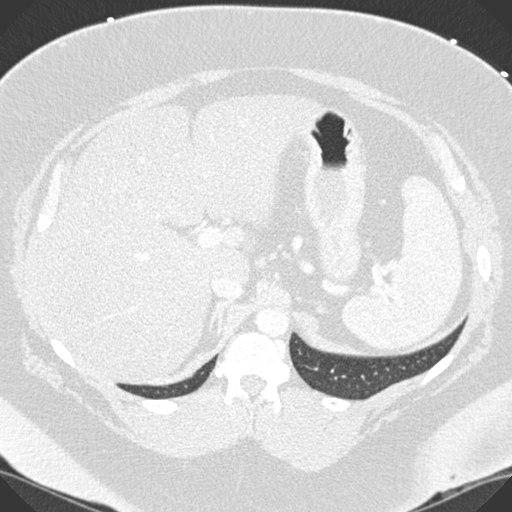
[im 29/284  mediastinal]
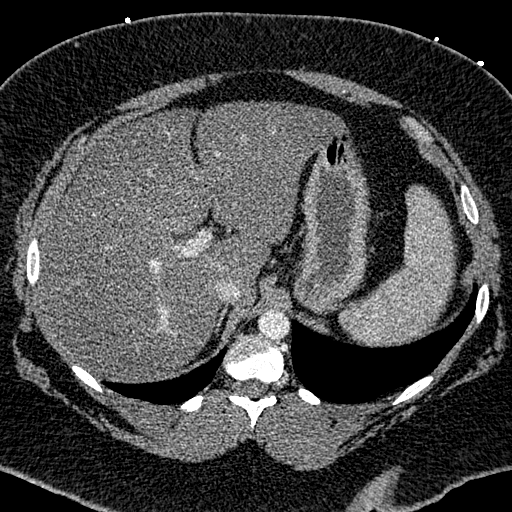
[im 43/284  lung]
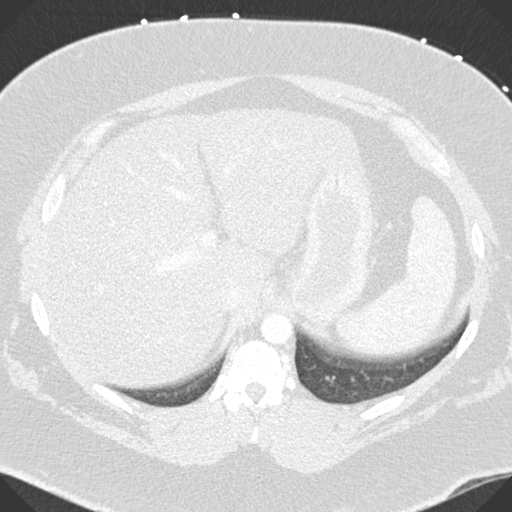
[im 57/284  mediastinal]
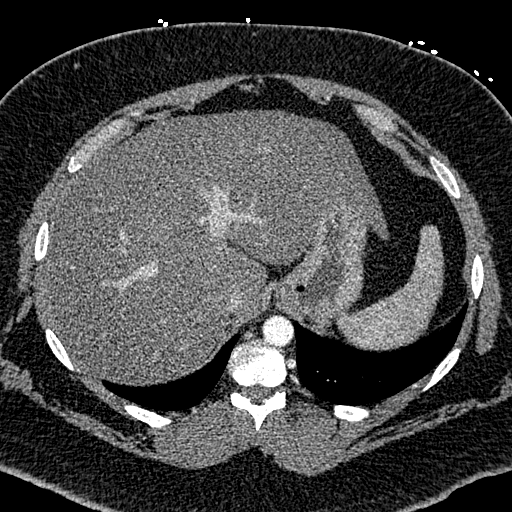
[im 85/284  lung]
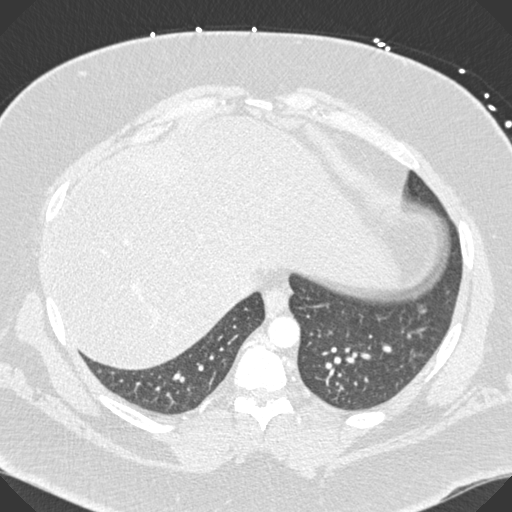
[im 100/284  mediastinal]
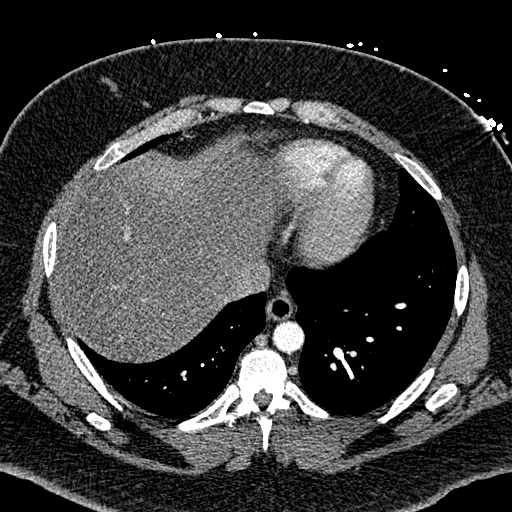
[im 114/284  lung]
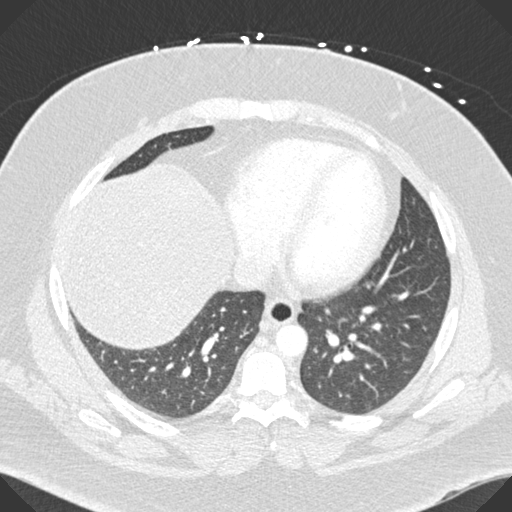
[im 128/284  mediastinal]
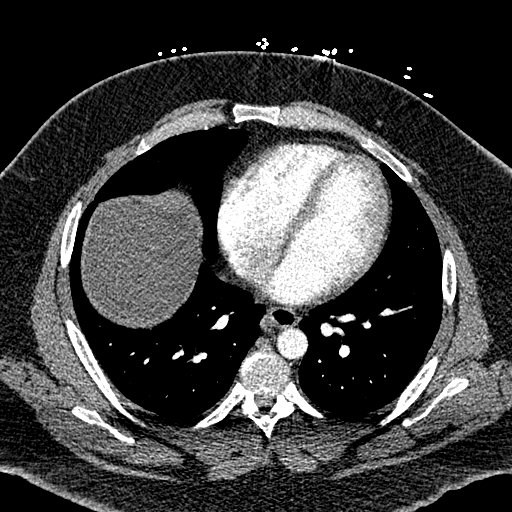
[im 142/284  lung]
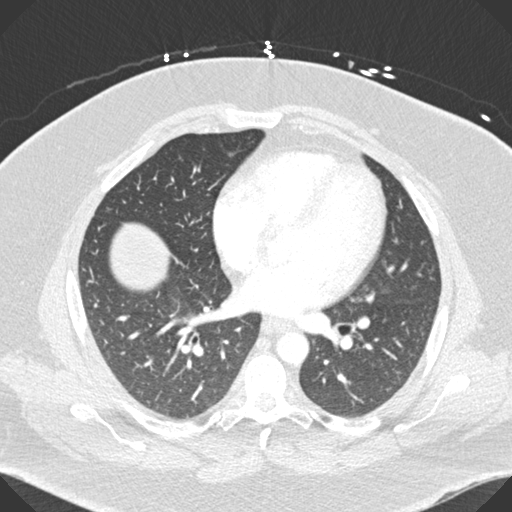
[im 156/284  mediastinal]
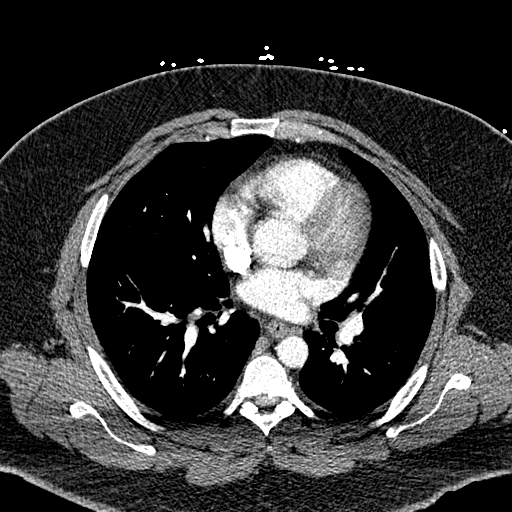
[im 170/284  lung]
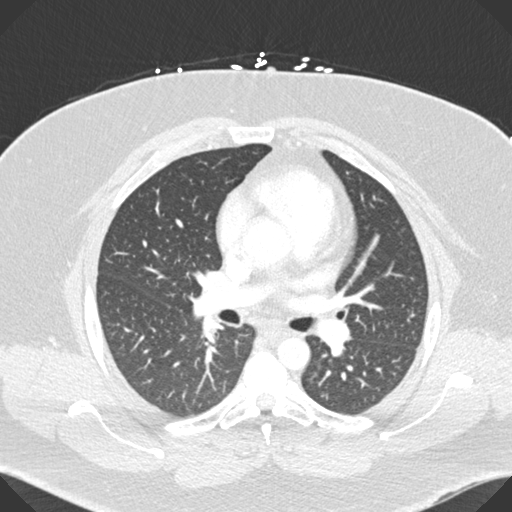
[im 184/284  mediastinal]
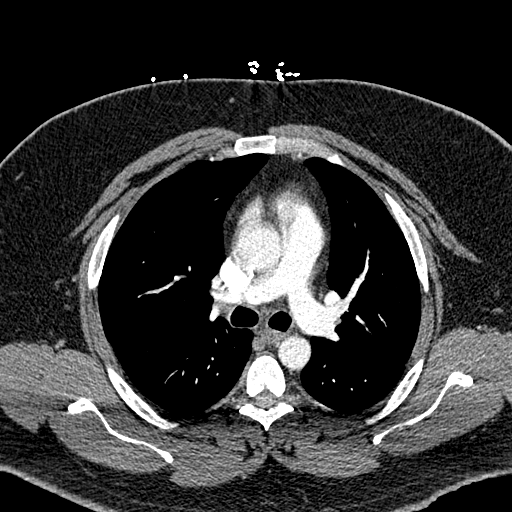
[im 199/284  lung]
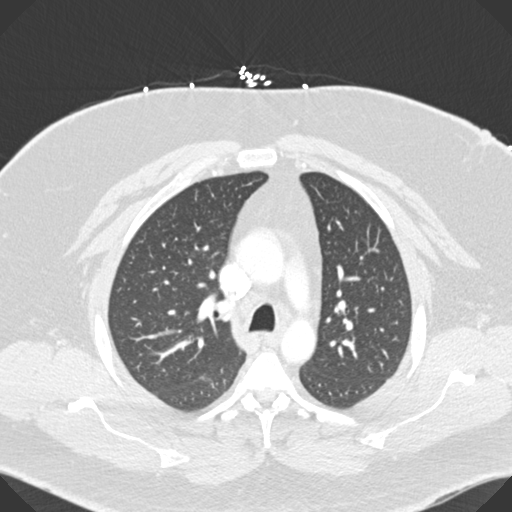
[im 227/284  mediastinal]
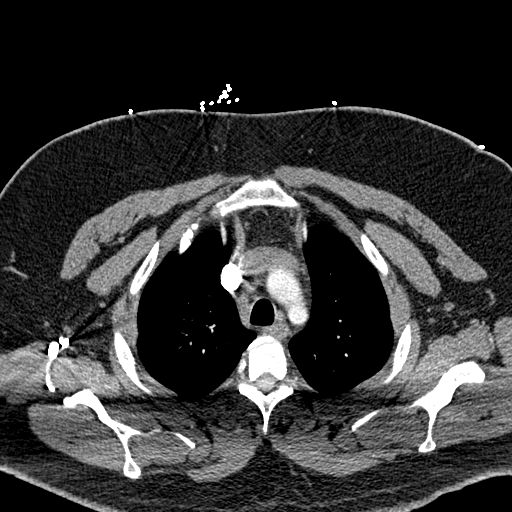
[im 241/284  lung]
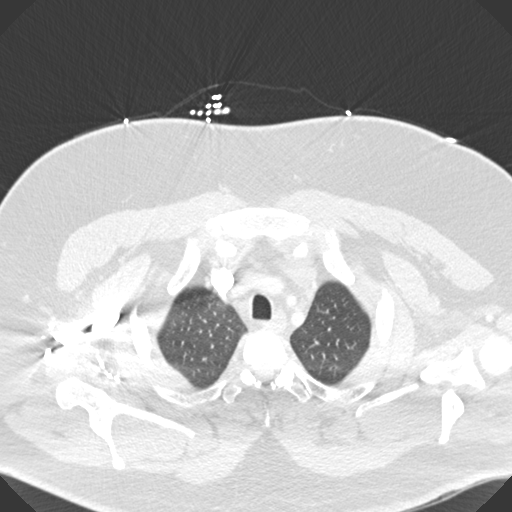
[im 255/284  mediastinal]
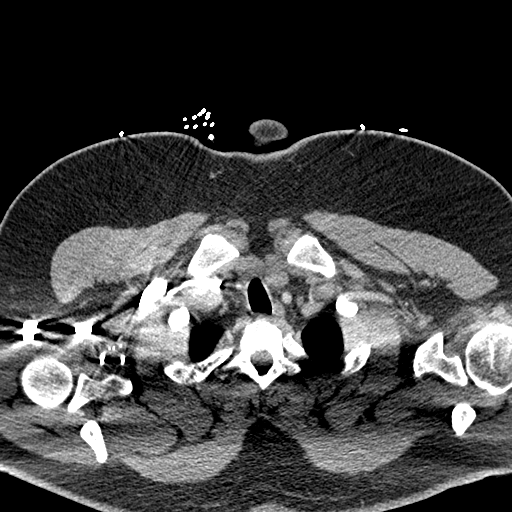
[im 269/284  lung]
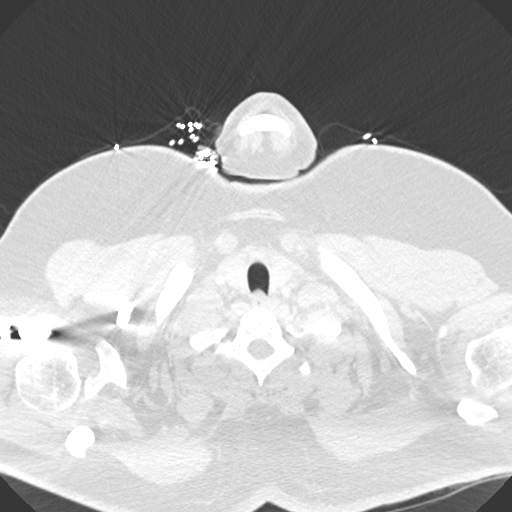

[18 of 36 positions shown; findings below may reference images not displayed]

FINDINGS: Thyroid gland normal. No pathologically enlarged mediastinal, hilar,
or axillary lymph nodes identified.

Intrathoracic aorta of normal caliber and appearance without acute
abnormality. Visualized great vessels within normal limits.

Heart size normal.  No pericardial effusion.

Pulmonary arterial tree adequately opacified for evaluation. Main
pulmonary artery within normal limits for size measuring 2.2 cm in
diameter. No convincing filling defects are seen to suggest acute
pulmonary embolism. Re-formatted imaging confirms these findings.

Lungs are clear without focal infiltrate. No pulmonary edema or
pleural effusion. No pneumothorax. No worrisome pulmonary nodule or
mass.

Hepatic steatosis noted. Multiple calcified stones present within
the gallbladder lumen. Remainder of the partially visualized upper
abdomen is otherwise unremarkable.

No acute osseous abnormality. No worrisome lytic or blastic osseous
lesions.

Review of the MIP images confirms the above findings.
IMPRESSION: 1. No CT evidence for acute pulmonary embolism.
2. No other acute cardiopulmonary abnormality identified.
3. Cholelithiasis.
4. Hepatic steatosis.

## 2017-08-22 IMAGING — DX DG CHEST 2V
2 series · 2 of 2 positions shown · non-contrast
Comparison: March 26, 2014

CLINICAL DATA: Three-day history of chest pain with deep
inspiration

EXAM:
CHEST  2 VIEW

[chest pa]
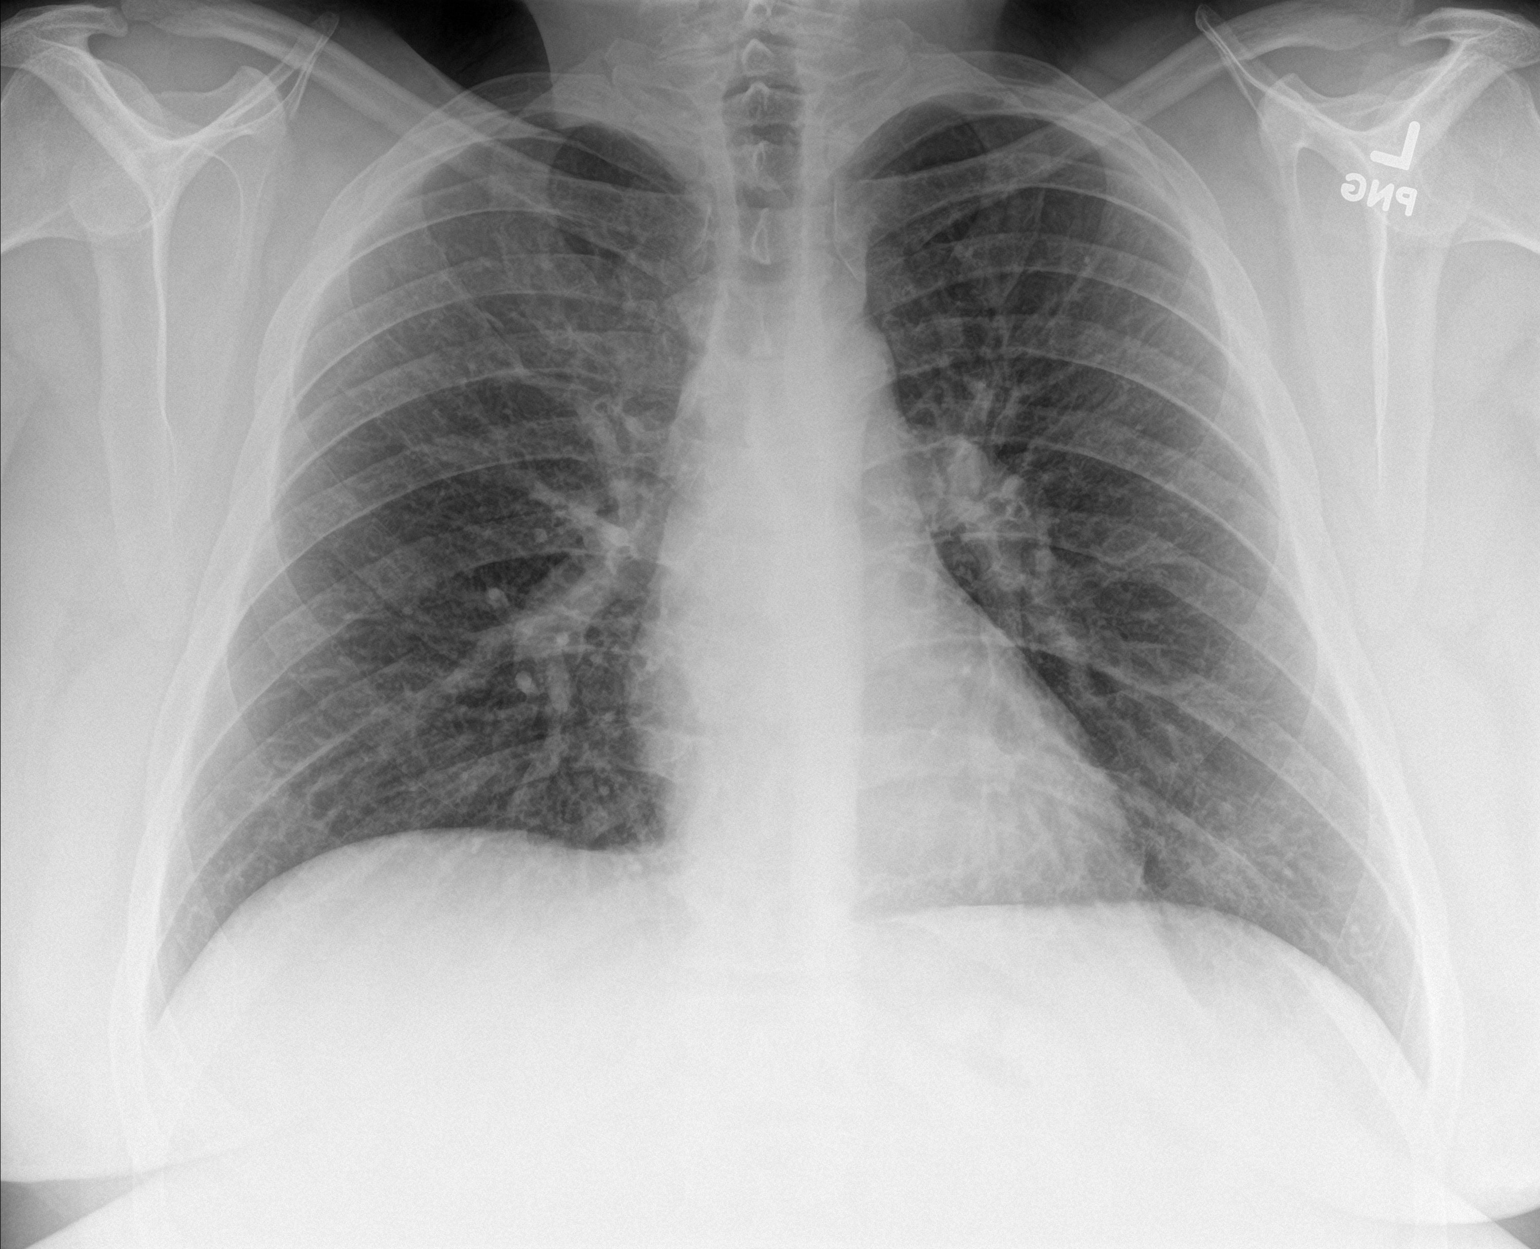

[chest lat]
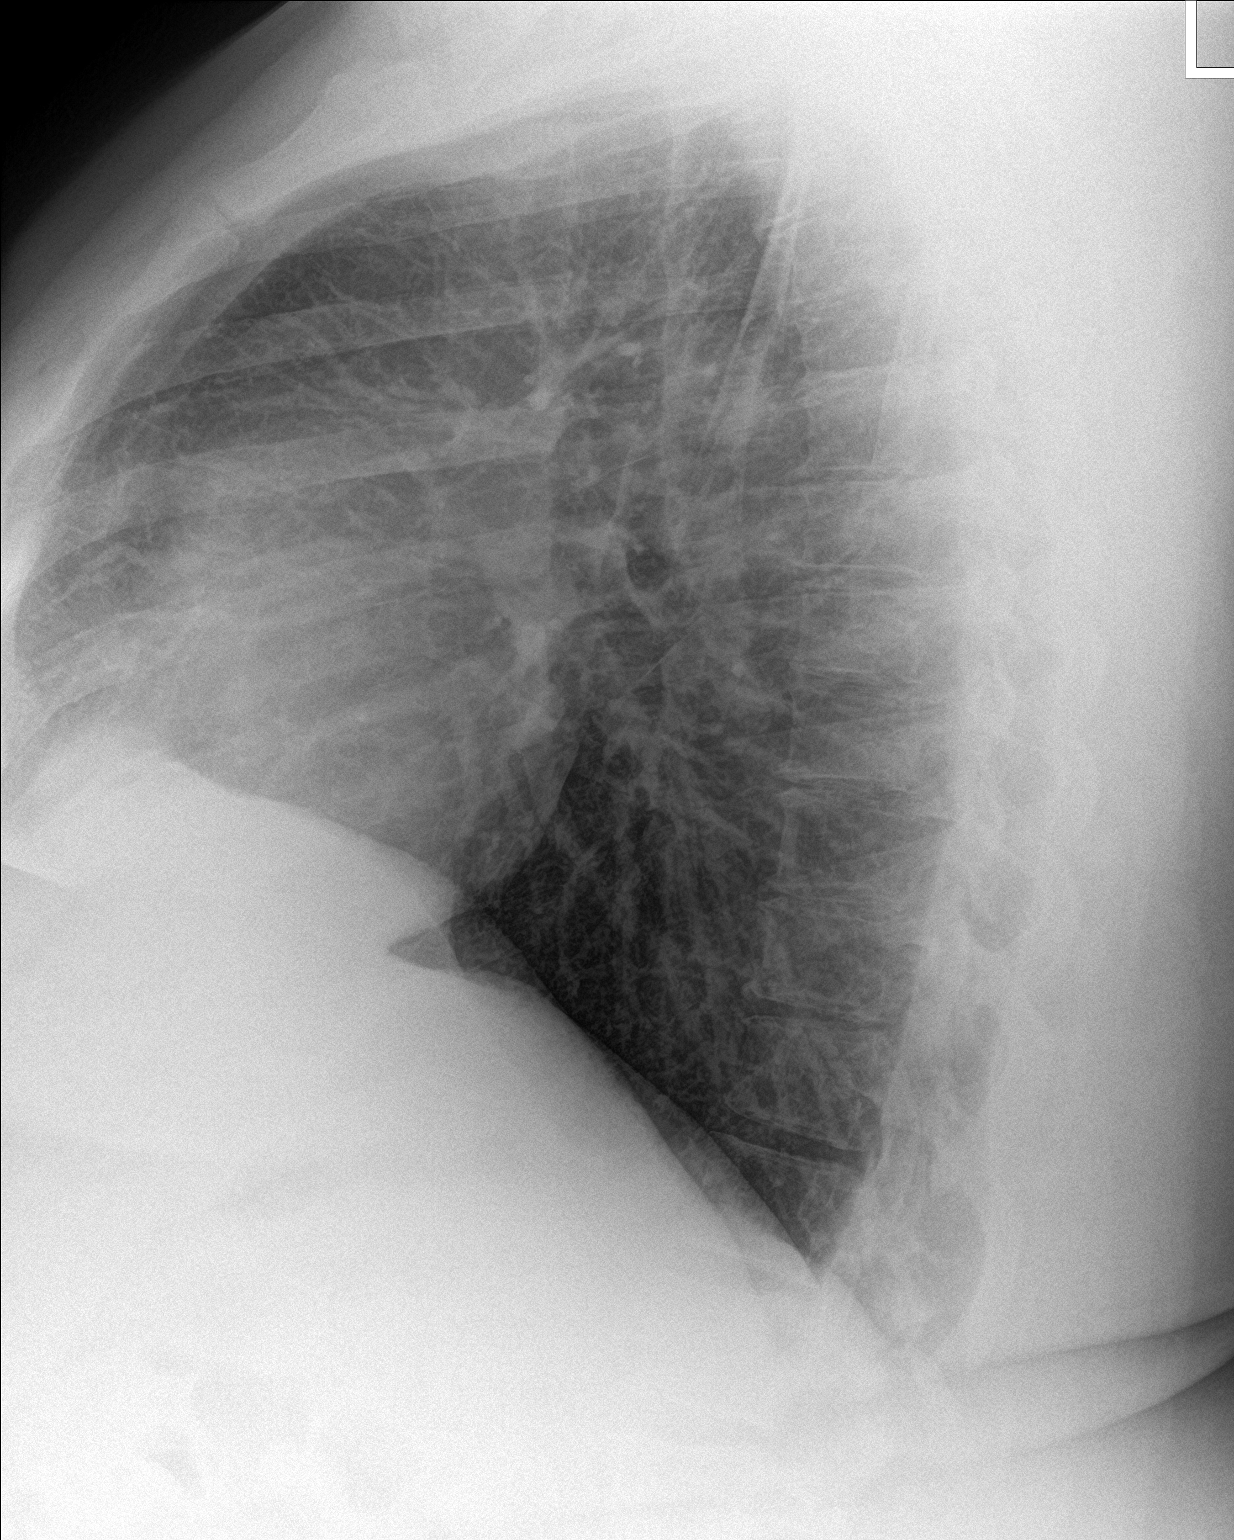

[2 of 2 positions shown; findings below may reference images not displayed]

FINDINGS: Lungs are clear. Heart size and pulmonary vascularity are normal. No
adenopathy. No pneumothorax. No bone lesions.
IMPRESSION: No edema or consolidation.

## 2017-12-09 ENCOUNTER — Encounter: Payer: Self-pay | Admitting: Internal Medicine

## 2017-12-09 NOTE — Telephone Encounter (Signed)
Documented flu shot.../LMB  

## 2018-03-26 ENCOUNTER — Ambulatory Visit: Payer: BC Managed Care – PPO | Admitting: Family Medicine

## 2018-03-26 ENCOUNTER — Encounter: Payer: Self-pay | Admitting: Family Medicine

## 2018-03-26 VITALS — BP 108/64 | HR 105 | Temp 98.9°F | Ht 70.0 in | Wt 355.0 lb

## 2018-03-26 DIAGNOSIS — J111 Influenza due to unidentified influenza virus with other respiratory manifestations: Secondary | ICD-10-CM | POA: Diagnosis not present

## 2018-03-26 LAB — POCT INFLUENZA A/B
INFLUENZA A, POC: POSITIVE — AB
Influenza B, POC: NEGATIVE

## 2018-03-26 MED ORDER — OSELTAMIVIR PHOSPHATE 75 MG PO CAPS: 75.0000 mg | ORAL_CAPSULE | Freq: Two times a day (BID) | ORAL | 0 refills | Status: DC

## 2018-03-26 NOTE — Progress Notes (Signed)
Patient: Stephen Freeman MRN: [id number] DOB: 12-29-1980 PCP: Pincus Sanes, MD     Subjective:  Chief Complaint  Patient presents with  . Cough  . Fever  . Generalized Body Aches    HPI: The patient is a 38 y.o. male who presents today for flu like symptoms. He started to feel bad on Tuesday. He had a cough, fatigue/body aches. He got a fever Tuesday night or Wednesday. He states highest reading is 99.5. cough is productive in nature with chunky yellow. He states he is congested on and off. Daughter was sick with similar symptoms, but is better and back in school. He has sinus pain/pressure. He has taken ibuprofen. Not a lot of cold/flu medication. No shortness of breath or wheezing.   Review of Systems  Constitutional: Positive for appetite change, chills, fatigue and fever.  HENT: Positive for congestion, sinus pressure, sinus pain and sore throat. Negative for ear pain, nosebleeds and postnasal drip.   Eyes: Positive for photophobia.  Respiratory: Positive for cough. Negative for shortness of breath.   Cardiovascular: Positive for chest pain.       C/o chest discomfort when coughing.  Cough is productive w/thick, dark yellow sputum  Gastrointestinal: Positive for nausea. Negative for abdominal pain and vomiting.  Musculoskeletal: Positive for arthralgias, myalgias and neck stiffness. Negative for back pain.       C/o achiness/pain in b/l knees  Neurological: Positive for dizziness and headaches.  Psychiatric/Behavioral: Positive for sleep disturbance.    Allergies Patient has No Known Allergies.  Past Medical History Patient  has a past medical history of Abscess of buttock, right, COMMON MIGRAINE, GERD, and OBESITY, MORBID.  Surgical History Patient  has a past surgical history that includes LASIK (2005) and Eye surgery.  Family History [name] family history includes Alcohol abuse in his father; Arthritis in an other family member; Diabetes in his maternal grandmother  and mother; Emphysema in his maternal grandfather; Healthy in his daughter; Heart disease in his maternal grandmother; Hypertension in his mother; Migraines in his maternal grandmother; Parkinson's disease in his maternal grandfather; Stroke in his father.  Social History Patient  reports that he has never smoked. He has never used smokeless tobacco. He reports current alcohol use. He reports that he does not use drugs.    Objective: Vitals:   03/26/18 1147  BP: 108/64  Pulse: (!) 105  Temp: 98.9 F (37.2 C)  TempSrc: Oral  SpO2: 98%  Weight: (!) 355 lb (161 kg)  Height: 5\' 10"  (1.778 m)    Body mass index is 50.94 kg/m.  Physical Exam Vitals signs reviewed.  Constitutional:      Appearance: He is obese. He is ill-appearing.  HENT:     Head: Normocephalic and atraumatic.     Right Ear: Tympanic membrane, ear canal and external ear normal.     Left Ear: Tympanic membrane, ear canal and external ear normal.     Nose: Congestion present.     Mouth/Throat:     Mouth: Mucous membranes are moist.  Eyes:     Extraocular Movements: Extraocular movements intact.     Pupils: Pupils are equal, round, and reactive to light.  Neck:     Musculoskeletal: Normal range of motion and neck supple. No neck rigidity.  Cardiovascular:     Rate and Rhythm: Normal rate and regular rhythm.     Heart sounds: Normal heart sounds.  Pulmonary:     Effort: Pulmonary effort is normal. No respiratory distress.  Breath sounds: Normal breath sounds. No wheezing or rales.  Abdominal:     General: Abdomen is flat. Bowel sounds are normal.     Palpations: Abdomen is soft.  Skin:    Capillary Refill: Capillary refill takes less than 2 seconds.  Neurological:     General: No focal deficit present.     Mental Status: He is alert and oriented to person, place, and time.  Psychiatric:        Mood and Affect: Mood normal.        Behavior: Behavior normal.    Flu: POSITIVE A     Assessment/plan: 1.  Flu + flu A. Within 48 hour window and would like tamiflu. Sent in for him and continue conservative therapy with fluids, rest, cool mist humidifier at night, honey, flonase and recommended otc cough medication. Precautions given for worsening symptoms, shortness of breath, etc. Hand washing precautions also given. No work until Monday.  - POCT Influenza A/B   Return if symptoms worsen or fail to improve.    Orland Mustard, MD [facility]   03/26/2018

## 2018-03-26 NOTE — Patient Instructions (Signed)
Start tamiflu today. One pill twice a day for 5 days.  Ibuprofen as needed for pain/fever Cool mist humidifier at night, honey for cough and I like robitussin Dm for cough    Influenza, Adult Influenza is also called "the flu." It is an infection in the lungs, nose, and throat (respiratory tract). It is caused by a virus. The flu causes symptoms that are similar to symptoms of a cold. It also causes a high fever and body aches. The flu spreads easily from person to person (is contagious). Getting a flu shot (influenza vaccination) every year is the best way to prevent the flu. What are the causes? This condition is caused by the influenza virus. You can get the virus by:  Breathing in droplets that are in the air from the cough or sneeze of a person who has the virus.  Touching something that has the virus on it (is contaminated) and then touching your mouth, nose, or eyes. What increases the risk? Certain things may make you more likely to get the flu. These include:  Not washing your hands often.  Having close contact with many people during cold and flu season.  Touching your mouth, eyes, or nose without first washing your hands.  Not getting a flu shot every year. You may have a higher risk for the flu, along with serious problems such as a lung infection (pneumonia), if you:  Are older than 65.  Are pregnant.  Have a weakened disease-fighting system (immune system) because of a disease or taking certain medicines.  Have a long-term (chronic) illness, such as: ? Heart, kidney, or lung disease. ? Diabetes. ? Asthma.  Have a liver disorder.  Are very overweight (morbidly obese).  Have anemia. This is a condition that affects your red blood cells. What are the signs or symptoms? Symptoms usually begin suddenly and last 4-14 days. They may include:  Fever and chills.  Headaches, body aches, or muscle aches.  Sore throat.  Cough.  Runny or stuffy (congested)  nose.  Chest discomfort.  Not wanting to eat as much as normal (poor appetite).  Weakness or feeling tired (fatigue).  Dizziness.  Feeling sick to your stomach (nauseous) or throwing up (vomiting). How is this treated? If the flu is found early, you can be treated with medicine that can help reduce how bad the illness is and how long it lasts (antiviral medicine). This may be given by mouth (orally) or through an IV tube. Taking care of yourself at home can help your symptoms get better. Your doctor may suggest:  Taking over-the-counter medicines.  Drinking plenty of fluids. The flu often goes away on its own. If you have very bad symptoms or other problems, you may be treated in a hospital. Follow these instructions at home:     Activity  Rest as needed. Get plenty of sleep.  Stay home from work or school as told by your doctor. ? Do not leave home until you do not have a fever for 24 hours without taking medicine. ? Leave home only to visit your doctor. Eating and drinking  Take an ORS (oral rehydration solution). This is a drink that is sold at pharmacies and stores.  Drink enough fluid to keep your pee (urine) pale yellow.  Drink clear fluids in small amounts as you are able. Clear fluids include: ? Water. ? Ice chips. ? Fruit juice that has water added (diluted fruit juice). ? Low-calorie sports drinks.  Eat bland, easy-to-digest foods  in small amounts as you are able. These foods include: ? Bananas. ? Applesauce. ? Rice. ? Lean meats. ? Toast. ? Crackers.  Do not eat or drink: ? Fluids that have a lot of sugar or caffeine. ? Alcohol. ? Spicy or fatty foods. General instructions  Take over-the-counter and prescription medicines only as told by your doctor.  Use a cool mist humidifier to add moisture to the air in your home. This can make it easier for you to breathe.  Cover your mouth and nose when you cough or sneeze.  Wash your hands with soap and  water often, especially after you cough or sneeze. If you cannot use soap and water, use alcohol-based hand sanitizer.  Keep all follow-up visits as told by your doctor. This is important. How is this prevented?   Get a flu shot every year. You may get the flu shot in late summer, fall, or winter. Ask your doctor when you should get your flu shot.  Avoid contact with people who are sick during fall and winter (cold and flu season). Contact a doctor if:  You get new symptoms.  You have: ? Chest pain. ? Watery poop (diarrhea). ? A fever.  Your cough gets worse.  You start to have more mucus.  You feel sick to your stomach.  You throw up. Get help right away if you:  Have shortness of breath.  Have trouble breathing.  Have skin or nails that turn a bluish color.  Have very bad pain or stiffness in your neck.  Get a sudden headache.  Get sudden pain in your face or ear.  Cannot eat or drink without throwing up. Summary  Influenza ("the flu") is an infection in the lungs, nose, and throat. It is caused by a virus.  Take over-the-counter and prescription medicines only as told by your doctor.  Getting a flu shot every year is the best way to avoid getting the flu. This information is not intended to replace advice given to you by your health care provider. Make sure you discuss any questions you have with your health care provider. Document Released: 10/24/2007 Document Revised: 07/02/2017 Document Reviewed: 07/02/2017 Elsevier Interactive Patient Education  2019 [employer].

## 2018-04-07 ENCOUNTER — Ambulatory Visit: Payer: BC Managed Care – PPO | Admitting: Internal Medicine

## 2018-04-07 ENCOUNTER — Encounter: Payer: Self-pay | Admitting: Physician Assistant

## 2018-04-07 ENCOUNTER — Ambulatory Visit: Payer: BC Managed Care – PPO | Admitting: Physician Assistant

## 2018-04-07 VITALS — BP 118/80 | HR 96 | Temp 99.4°F | Ht 70.0 in | Wt 352.4 lb

## 2018-04-07 DIAGNOSIS — J020 Streptococcal pharyngitis: Secondary | ICD-10-CM | POA: Diagnosis not present

## 2018-04-07 LAB — POCT RAPID STREP A (OFFICE): RAPID STREP A SCREEN: POSITIVE — AB

## 2018-04-07 MED ORDER — PREDNISONE 5 MG PO TABS: ORAL_TABLET | ORAL | 0 refills | Status: DC

## 2018-04-07 MED ORDER — AMOXICILLIN 875 MG PO TABS: 875.0000 mg | ORAL_TABLET | Freq: Two times a day (BID) | ORAL | 0 refills | Status: DC

## 2018-04-07 NOTE — Patient Instructions (Signed)
It was great to see you!  You have strep throat.  Let's start amoxicillin and oral prednisone.  If you develop fever, you may use Tylenol. Avoid ibuprofen while on prednisone.  Push fluids and get plenty of rest. Please return if you are not improving as expected, or if you have high fevers (>101.5) or difficulty swallowing or worsening productive cough.  Call clinic with questions.  I hope you start feeling better soon!

## 2018-04-07 NOTE — Progress Notes (Signed)
Stephen Freeman is a 38 y.o. male here for a new problem.  History of Present Illness:   Chief Complaint  Patient presents with  . Wheezing    x1 week  . Sore Throat    HPI  Was diagnosed with Flu A on 03/26/18. He never took tamiflu but overall recovered well except for post-viral cough that he has been treating with mucinex, flonase, zyrtec. His sore throat started yesterday.  Both kids at home with strep throat. Wife not sick. Denies: poor appetite, changes in BMs, fever, chills, ear pain.   Past Medical History:  Diagnosis Date  . Abscess of buttock, right   . COMMON MIGRAINE   . GERD   . OBESITY, MORBID      Social History   Socioeconomic History  . Marital status: Married    Spouse name: Not on file  . Number of children: Not on file  . Years of education: Not on file  . Highest education level: Not on file  Occupational History  . Occupation: Systems Admin    Employer: [employer]  Social Needs  . Financial resource strain: Not on file  . Food insecurity:    Worry: Not on file    Inability: Not on file  . Transportation needs:    Medical: Not on file    Non-medical: Not on file  Tobacco Use  . Smoking status: Never Smoker  . Smokeless tobacco: Never Used  Substance and Sexual Activity  . Alcohol use: Yes    Alcohol/week: 0.0 standard drinks    Comment: Rarely  . Drug use: No  . Sexual activity: Not Currently  Lifestyle  . Physical activity:    Days per week: Not on file    Minutes per session: Not on file  . Stress: Not on file  Relationships  . Social connections:    Talks on phone: Not on file    Gets together: Not on file    Attends religious service: Not on file    Active member of club or organization: Not on file    Attends meetings of clubs or organizations: Not on file    Relationship status: Not on file  . Intimate partner violence:    Fear of current or ex partner: Not on file    Emotionally abused: Not on file    Physically abused: Not on  file    Forced sexual activity: Not on file  Other Topics Concern  . Not on file  Social History Narrative   Lives with wife and 2 children     Works as a Engineer, structural.     Education: Event organiser.      Intermittent exercise    Past Surgical History:  Procedure Laterality Date  . EYE SURGERY    . LASIK  2005    Family History  Problem Relation Age of Onset  . Diabetes Mother   . Hypertension Mother   . Alcohol abuse Father   . Stroke Father   . Heart disease Maternal Grandmother   . Diabetes Maternal Grandmother   . Migraines Maternal Grandmother   . Emphysema Maternal Grandfather        smoked  . Parkinson's disease Maternal Grandfather   . Arthritis Other        grandparent  . Healthy Daughter     No Known Allergies  Current Medications:   Current Outpatient Medications:  .  Ascorbic Acid (VITAMIN C) 100 MG tablet, Take 100 mg by mouth daily.,  Disp: , Rfl:  .  ibuprofen (ADVIL,MOTRIN) 200 MG tablet, Take 600 mg by mouth every 6 (six) hours as needed., Disp: , Rfl:  .  amoxicillin (AMOXIL) 875 MG tablet, Take 1 tablet (875 mg total) by mouth 2 (two) times daily., Disp: 20 tablet, Rfl: 0 .  oseltamivir (TAMIFLU) 75 MG capsule, Take 1 capsule (75 mg total) by mouth 2 (two) times daily. (Patient not taking: Reported on 04/07/2018), Disp: 10 capsule, Rfl: 0 .  predniSONE (DELTASONE) 5 MG tablet, 6-5-4-3-2-1-off, Disp: 21 tablet, Rfl: 0   Review of Systems:   Review of Systems  Constitutional: Negative for chills, fever, malaise/fatigue and weight loss.  HENT: Positive for sore throat.   Respiratory: Positive for cough. Negative for hemoptysis and shortness of breath.   Cardiovascular: Negative for chest pain, orthopnea, claudication and leg swelling.  Gastrointestinal: Negative for heartburn, nausea and vomiting.  Neurological: Negative for dizziness, tingling and headaches.    Vitals:   Vitals:   04/07/18 1545  BP: 118/80  Pulse: 96  Temp: 99.4 F  (37.4 C)  TempSrc: Oral  SpO2: 96%  Weight: (!) 352 lb 6.1 oz (159.8 kg)  Height:  (1.778 m)     Body mass index is 50.56 kg/m.  Physical Exam:   Physical Exam Vitals signs and nursing note reviewed.  Constitutional:      General: He is not in acute distress.    Appearance: He is well-developed. He is not ill-appearing or toxic-appearing.  HENT:     Head: Normocephalic and atraumatic.     Right Ear: Tympanic membrane, ear canal and external ear normal. Tympanic membrane is not erythematous, retracted or bulging.     Left Ear: Tympanic membrane, ear canal and external ear normal. Tympanic membrane is not erythematous, retracted or bulging.     Nose: Nose normal.     Right Sinus: No maxillary sinus tenderness or frontal sinus tenderness.     Left Sinus: No maxillary sinus tenderness or frontal sinus tenderness.     Mouth/Throat:     Lips: Pink.     Mouth: Mucous membranes are moist.     Pharynx: Uvula midline. Posterior oropharyngeal erythema present.     Tonsils: No tonsillar exudate. Swelling: 2+ on the right. 2+ on the left.  Eyes:     General: Lids are normal.     Conjunctiva/sclera: Conjunctivae normal.  Neck:     Trachea: Trachea normal.  Cardiovascular:     Rate and Rhythm: Normal rate and regular rhythm.     Heart sounds: Normal heart sounds, S1 normal and S2 normal.  Pulmonary:     Effort: Pulmonary effort is normal.     Breath sounds: Normal breath sounds. No decreased breath sounds, wheezing, rhonchi or rales.  Lymphadenopathy:     Cervical: No cervical adenopathy.  Skin:    General: Skin is warm and dry.  Neurological:     Mental Status: He is alert.  Psychiatric:        Speech: Speech normal.        Behavior: Behavior normal. Behavior is cooperative.     Results for orders placed or performed in visit on 04/07/18  POCT rapid strep A  Result Value Ref Range   Rapid Strep A Screen Positive (A) Negative    Assessment and Plan:   Stephen Freeman was seen  today for wheezing and sore throat.  Diagnoses and all orders for this visit:  Strep pharyngitis No red flags on exam.  Rapid  strep positive. Will initiate prednisone and amoxicillin per orders. Discussed taking medications as prescribed. Reviewed return precautions including worsening fever, SOB, worsening cough or other concerns. Push fluids and rest. I recommend that patient follow-up if symptoms worsen or persist despite treatment x 7-10 days, sooner if needed. -     POCT rapid strep A  Other orders -     predniSONE (DELTASONE) 5 MG tablet; 6-5-4-3-2-1-off -     amoxicillin (AMOXIL) 875 MG tablet; Take 1 tablet (875 mg total) by mouth 2 (two) times daily.  . Reviewed expectations re: course of current medical issues. . Discussed self-management of symptoms. . Outlined signs and symptoms indicating need for more acute intervention. . Patient verbalized understanding and all questions were answered. . See orders for this visit as documented in the electronic medical record. . Patient received an After-Visit Summary.  Jarold Motto, PA-C

## 2018-04-08 ENCOUNTER — Ambulatory Visit: Payer: BC Managed Care – PPO | Admitting: Internal Medicine

## 2018-06-11 ENCOUNTER — Encounter: Payer: Self-pay | Admitting: Internal Medicine

## 2018-06-11 ENCOUNTER — Other Ambulatory Visit: Payer: Self-pay

## 2018-06-11 ENCOUNTER — Ambulatory Visit (INDEPENDENT_AMBULATORY_CARE_PROVIDER_SITE_OTHER): Payer: BC Managed Care – PPO | Admitting: Internal Medicine

## 2018-06-11 ENCOUNTER — Other Ambulatory Visit (INDEPENDENT_AMBULATORY_CARE_PROVIDER_SITE_OTHER): Payer: BC Managed Care – PPO

## 2018-06-11 VITALS — BP 132/80 | HR 79 | Temp 98.7°F | Resp 18 | Ht 70.0 in | Wt 361.0 lb

## 2018-06-11 DIAGNOSIS — Z0000 Encounter for general adult medical examination without abnormal findings: Secondary | ICD-10-CM

## 2018-06-11 DIAGNOSIS — M109 Gout, unspecified: Secondary | ICD-10-CM | POA: Insufficient documentation

## 2018-06-11 DIAGNOSIS — Z833 Family history of diabetes mellitus: Secondary | ICD-10-CM | POA: Diagnosis not present

## 2018-06-11 LAB — COMPREHENSIVE METABOLIC PANEL
ALT: 38 U/L (ref 0–53)
AST: 27 U/L (ref 0–37)
Albumin: 4.3 g/dL (ref 3.5–5.2)
Alkaline Phosphatase: 59 U/L (ref 39–117)
BUN: 15 mg/dL (ref 6–23)
CO2: 27 mEq/L (ref 19–32)
Calcium: 9.5 mg/dL (ref 8.4–10.5)
Chloride: 105 mEq/L (ref 96–112)
Creatinine, Ser: 0.95 mg/dL (ref 0.40–1.50)
GFR: 88.69 mL/min (ref >60.00)
Glucose, Bld: 85 mg/dL (ref 70–99)
Potassium: 4.1 mEq/L (ref 3.5–5.1)
Sodium: 140 mEq/L (ref 135–145)
Total Bilirubin: 0.6 mg/dL (ref 0.2–1.2)
Total Protein: 7.1 g/dL (ref 6.0–8.3)

## 2018-06-11 LAB — CBC WITH DIFFERENTIAL/PLATELET
Basophils Absolute: 0.1 10*3/uL (ref 0.0–0.1)
Basophils Relative: 0.9 % (ref 0.0–3.0)
Eosinophils Absolute: 0.2 10*3/uL (ref 0.0–0.7)
Eosinophils Relative: 1.8 % (ref 0.0–5.0)
HCT: 42.3 % (ref 39.0–52.0)
Hemoglobin: 14.4 g/dL (ref 13.0–17.0)
Lymphocytes Relative: 22.7 % (ref 12.0–46.0)
Lymphs Abs: 2 10*3/uL (ref 0.7–4.0)
MCHC: 34.1 g/dL (ref 30.0–36.0)
MCV: 86 fl (ref 78.0–100.0)
Monocytes Absolute: 0.9 10*3/uL (ref 0.1–1.0)
Monocytes Relative: 10.4 % (ref 3.0–12.0)
Neutro Abs: 5.8 10*3/uL (ref 1.4–7.7)
Neutrophils Relative %: 64.2 % (ref 43.0–77.0)
Platelets: 269 10*3/uL (ref 150.0–400.0)
RBC: 4.91 Mil/uL (ref 4.22–5.81)
RDW: 13.8 % (ref 11.5–15.5)
WBC: 9 10*3/uL (ref 4.0–10.5)

## 2018-06-11 LAB — HEMOGLOBIN A1C: Hgb A1c MFr Bld: 5.9 % (ref 4.6–6.5)

## 2018-06-11 LAB — LIPID PANEL
Cholesterol: 169 mg/dL (ref 0–200)
HDL: 42.7 mg/dL (ref >39.00)
LDL Cholesterol: 107 mg/dL — ABNORMAL HIGH (ref 0–99)
NonHDL: 126.35
Total CHOL/HDL Ratio: 4
Triglycerides: 99 mg/dL (ref 0.0–149.0)
VLDL: 19.8 mg/dL (ref 0.0–40.0)

## 2018-06-11 LAB — TSH: TSH: 2.15 u[IU]/mL (ref 0.35–4.50)

## 2018-06-11 LAB — URIC ACID: Uric Acid, Serum: 7.4 mg/dL (ref 4.0–7.8)

## 2018-06-11 LAB — SEDIMENTATION RATE: Sed Rate: 13 mm/hr (ref 0–15)

## 2018-06-11 MED ORDER — INDOMETHACIN 50 MG PO CAPS: 50.0000 mg | ORAL_CAPSULE | Freq: Three times a day (TID) | ORAL | 0 refills | Status: DC

## 2018-06-11 NOTE — Progress Notes (Signed)
Subjective:    Patient ID: Stephen Freeman, male    DOB: 06-05-1980, 38 y.o.   MRN: [id number]  HPI The patient is here for an acute visit.   Right toe pain:  It started 36 hrs ago -he thinks he woke up with the pain.   He is unsure if it was hurting the night before.  He has taken ibuprofen since it started on occasion and that has helped.  He took ibuprofen earlier today which has helped with the pain.  The joint feels stiff and may be a little swollen.   Last night it looked a little red.  This morning it felt a little warm. He denies N/T in the foot.   He denies fever/chills.    Denies similar episodes in the past.  He denies a family history of gout.  Medications and allergies reviewed with patient and updated if appropriate.  Patient Active Problem List   Diagnosis Date Noted  . 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 Pain of anterior chest wall with respiration 09/15/2015  . Allergic rhinitis 08/03/2015  . 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 OBESITY, MORBID 06/16/2009  . COMMON MIGRAINE 06/16/2009    No current outpatient medications on file prior to visit.   No current facility-administered medications on file prior to visit.     Past Medical History:  Diagnosis Date  . Abscess of buttock, right   . COMMON MIGRAINE   . GERD   . OBESITY, MORBID     Past Surgical History:  Procedure Laterality Date  . EYE SURGERY    . LASIK  2005    Social History   Socioeconomic History  . Marital status: Married    Spouse name: Not on file  . Number of children: Not on file  . Years of education: Not on file  . Highest education level: Not on file  Occupational History  . Occupation: Systems Admin    Employer: [employer]  Social Needs  . Financial resource strain: Not on file  . Food insecurity:    Worry: Not on file    Inability: Not on file  . Transportation needs:    Medical: Not on file    Non-medical: Not on file  Tobacco Use  . Smoking status: Never Smoker  .  Smokeless tobacco: Never Used  Substance and Sexual Activity  . Alcohol use: Yes    Alcohol/week: 0.0 standard drinks    Comment: Rarely  . Drug use: No  . Sexual activity: Not Currently  Lifestyle  . Physical activity:    Days per week: Not on file    Minutes per session: Not on file  . Stress: Not on file  Relationships  . Social connections:    Talks on phone: Not on file    Gets together: Not on file    Attends religious service: Not on file    Active member of club or organization: Not on file    Attends meetings of clubs or organizations: Not on file    Relationship status: Not on file  Other Topics Concern  . Not on file  Social History Narrative   Lives with wife and 2 children     Works as a Engineer, structural.     Education: Event organiser.      Intermittent exercise    Family History  Problem Relation Age of Onset  . Diabetes Mother   . Hypertension Mother   . Alcohol abuse Father   . Stroke  Father   . Heart disease Maternal Grandmother   . Diabetes Maternal Grandmother   . Migraines Maternal Grandmother   . Emphysema Maternal Grandfather        smoked  . Parkinson's disease Maternal Grandfather   . Arthritis Other        grandparent  . Healthy Daughter     Review of Systems  Constitutional: Negative for chills and fever.  Musculoskeletal: Positive for arthralgias (just the right toe) and joint swelling.  Skin: Positive for color change. Negative for wound.  Neurological: Negative for numbness.       Objective:   Vitals:   06/11/18 0938  BP: 132/80  Pulse: 79  Resp: 18  Temp: 98.7 F (37.1 C)  SpO2: 99%   BP Readings from Last 3 Encounters:  06/11/18 132/80  04/07/18 118/80  03/26/18 108/64   Wt Readings from Last 3 Encounters:  06/11/18 (!) 361 lb (163.7 kg)  04/07/18 (!) 352 lb 6.1 oz (159.8 kg)  03/26/18 (!) 355 lb (161 kg)   Body mass index is 51.8 kg/m.   Physical Exam Constitutional:      General: He is not in acute  distress.    Appearance: Normal appearance. He is not ill-appearing.  Cardiovascular:     Comments: Normal dorsal pedis and posterior tibialis pulse right foot Musculoskeletal:     Comments: Mild swelling and warmth right MTP joint of big toe.  Pain with passive and active movement  Skin:    General: Skin is warm and dry.     Findings: Erythema (Minimal erythema right MTP joint) present.  Neurological:     Mental Status: He is alert.     Sensory: No sensory deficit.            Assessment & Plan:    See Problem List for Assessment and Plan of chronic medical problems.

## 2018-06-11 NOTE — Assessment & Plan Note (Signed)
Numbness consistent with acute gout No prior episodes no family history Will treat with indomethacin 50 mg 3 times daily with food-advised to take for 1 day after symptoms resolve He is due for a physical and we will do routine blood work to make sure his sugars are not elevated given his family history of diabetes Check uric acid level, ESR, CMP We will schedule physical in 2 weeks

## 2018-06-11 NOTE — Patient Instructions (Addendum)
Tests ordered today. Your results will be released to MyChart (or called to you) after review, usually within 72hours after test completion. If any changes need to be made, you will be notified at that same time.   Medications reviewed and updated.  Changes include :   Indomethacin three times a day for gout -- take one day after your pain resolves.   Your prescription(s) have been submitted to your pharmacy. Please take as directed and contact our office if you believe you are having problem(s) with the medication(s).   Please followup in 2 week for a physical     Gout  Gout is a condition that causes painful swelling of the joints. Gout is a type of inflammation of the joints (arthritis). This condition is caused by having too much uric acid in the body. Uric acid is a chemical that forms when the body breaks down substances called purines. Purines are important for building body proteins. When the body has too much uric acid, sharp crystals can form and build up inside the joints. This causes pain and swelling. Gout attacks can happen quickly and may be very painful (acute gout). Over time, the attacks can affect more joints and become more frequent (chronic gout). Gout can also cause uric acid to build up under the skin and inside the kidneys. What are the causes? This condition is caused by too much uric acid in your blood. This can happen because:  Your kidneys do not remove enough uric acid from your blood. This is the most common cause.  Your body makes too much uric acid. This can happen with some cancers and cancer treatments. It can also occur if your body is breaking down too many red blood cells (hemolytic anemia).  You eat too many foods that are high in purines. These foods include organ meats and some seafood. Alcohol, especially beer, is also high in purines. A gout attack may be triggered by trauma or stress. What increases the risk? You are more likely to develop this  condition if you:  Have a family history of gout.  Are male and middle-aged.  Are male and have gone through menopause.  Are obese.  Frequently drink alcohol, especially beer.  Are dehydrated.  Lose weight too quickly.  Have an organ transplant.  Have lead poisoning.  Take certain medicines, including aspirin, cyclosporine, diuretics, levodopa, and niacin.  Have kidney disease.  Have a skin condition called psoriasis. What are the signs or symptoms? An attack of acute gout happens quickly. It usually occurs in just one joint. The most common place is the big toe. Attacks often start at night. Other joints that may be affected include joints of the feet, ankle, knee, fingers, wrist, or elbow. Symptoms of this condition may include:  Severe pain.  Warmth.  Swelling.  Stiffness.  Tenderness. The affected joint may be very painful to touch.  Shiny, red, or purple skin.  Chills and fever. Chronic gout may cause symptoms more frequently. More joints may be involved. You may also have white or yellow lumps (tophi) on your hands or feet or in other areas near your joints. How is this diagnosed? This condition is diagnosed based on your symptoms, medical history, and physical exam. You may have tests, such as:  Blood tests to measure uric acid levels.  Removal of joint fluid with a thin needle (aspiration) to look for uric acid crystals.  X-rays to look for joint damage. How is this treated? Treatment for  this condition has two phases: treating an acute attack and preventing future attacks. Acute gout treatment may include medicines to reduce pain and swelling, including:  NSAIDs.  Steroids. These are strong anti-inflammatory medicines that can be taken by mouth (orally) or injected into a joint.  Colchicine. This medicine relieves pain and swelling when it is taken soon after an attack. It can be given by mouth or through an IV. Preventive treatment may  include:  Daily use of smaller doses of NSAIDs or colchicine.  Use of a medicine that reduces uric acid levels in your blood.  Changes to your diet. You may need to see a dietitian about what to eat and drink to prevent gout. Follow these instructions at home: During a gout attack   If directed, put ice on the affected area: ? Put ice in a plastic bag. ? Place a towel between your skin and the bag. ? Leave the ice on for 20 minutes, 2-3 times a day.  Raise (elevate) the affected joint above the level of your heart as often as possible.  Rest the joint as much as possible. If the affected joint is in your leg, you may be given crutches to use.  Follow instructions from your health care provider about eating or drinking restrictions. Avoiding future gout attacks  Follow a low-purine diet as told by your dietitian or health care provider. Avoid foods and drinks that are high in purines, including liver, kidney, anchovies, asparagus, herring, mushrooms, mussels, and beer.  Maintain a healthy weight or lose weight if you are overweight. If you want to lose weight, talk with your health care provider. It is important that you do not lose weight too quickly.  Start or maintain an exercise program as told by your health care provider. Eating and drinking  Drink enough fluids to keep your urine pale yellow.  If you drink alcohol: ? Limit how much you use to:  0-1 drink a day for women.  0-2 drinks a day for men. ? Be aware of how much alcohol is in your drink. In the [place] one drink equals one 12 oz bottle of beer (355 mL) one 5 oz glass of wine (148 mL), or one 1 oz glass of hard liquor (44 mL). General instructions  Take over-the-counter and prescription medicines only as told by your health care provider.  Do not drive or use heavy machinery while taking prescription pain medicine.  Return to your normal activities as told by your health care provider. Ask your health care  provider what activities are safe for you.  Keep all follow-up visits as told by your health care provider. This is important. Contact a health care provider if you have:  Another gout attack.  Continuing symptoms of a gout attack after 10 days of treatment.  Side effects from your medicines.  Chills or a fever.  Burning pain when you urinate.  Pain in your lower back or belly. Get help right away if you:  Have severe or uncontrolled pain.  Cannot urinate. Summary  Gout is painful swelling of the joints caused by inflammation.  The most common site of pain is the big toe, but it can affect other joints in the body.  Medicines and dietary changes can help to prevent and treat gout attacks. This information is not intended to replace advice given to you by your health care provider. Make sure you discuss any questions you have with your health care provider. Document Released: 01/12/2000 Document  Revised: 08/06/2017 Document Reviewed: 08/06/2017 Elsevier Interactive Patient Education  [employer]2019 [employer]

## 2018-06-23 DIAGNOSIS — R7303 Prediabetes: Secondary | ICD-10-CM | POA: Insufficient documentation

## 2018-06-23 NOTE — Patient Instructions (Addendum)
We reviewed your blood work.    All other Health Maintenance issues reviewed.   All recommended immunizations and age-appropriate screenings are up-to-date or discussed.  No immunizations administered today.   Medications reviewed and updated.  Changes include :  none    Please followup in one year    Health Maintenance, Male A healthy lifestyle and preventive care is important for your health and wellness. Ask your health care provider about what schedule of regular examinations is right for you. What should I know about weight and diet? Eat a Healthy Diet  Eat plenty of vegetables, fruits, whole grains, low-fat dairy products, and lean protein.  Do not eat a lot of foods high in solid fats, added sugars, or salt.  Maintain a Healthy Weight Regular exercise can help you achieve or maintain a healthy weight. You should:  Do at least 150 minutes of exercise each week. The exercise should increase your heart rate and make you sweat (moderate-intensity exercise).  Do strength-training exercises at least twice a week. Watch Your Levels of Cholesterol and Blood Lipids  Have your blood tested for lipids and cholesterol every 5 years starting at 38 years of age. If you are at high risk for heart disease, you should start having your blood tested when you are 38 years old. You may need to have your cholesterol levels checked more often if: ? Your lipid or cholesterol levels are high. ? You are older than 38 years of age. ? You are at high risk for heart disease. What should I know about cancer screening? Many types of cancers can be detected early and may often be prevented. Lung Cancer  You should be screened every year for lung cancer if: ? You are a current smoker who has smoked for at least 30 years. ? You are a former smoker who has quit within the past 15 years.  Talk to your health care provider about your screening options, when you should start screening, and how often  you should be screened. Colorectal Cancer  Routine colorectal cancer screening usually begins at 38 years of age and should be repeated every 5-10 years until you are 31105 years old. You may need to be screened more often if early forms of precancerous polyps or small growths are found. Your health care provider may recommend screening at an earlier age if you have risk factors for colon cancer.  Your health care provider may recommend using home test kits to check for hidden blood in the stool.  A small camera at the end of a tube can be used to examine your colon (sigmoidoscopy or colonoscopy). This checks for the earliest forms of colorectal cancer. Prostate and Testicular Cancer  Depending on your age and overall health, your health care provider may do certain tests to screen for prostate and testicular cancer.  Talk to your health care provider about any symptoms or concerns you have about testicular or prostate cancer. Skin Cancer  Check your skin from head to toe regularly.  Tell your health care provider about any new moles or changes in moles, especially if: ? There is a change in a mole's size, shape, or color. ? You have a mole that is larger than a pencil eraser.  Always use sunscreen. Apply sunscreen liberally and repeat throughout the day.  Protect yourself by wearing long sleeves, pants, a wide-brimmed hat, and sunglasses when outside. What should I know about heart disease, diabetes, and high blood pressure?  If you  are 70-14 years of age, have your blood pressure checked every 3-5 years. If you are 53 years of age or older, have your blood pressure checked every year. You should have your blood pressure measured twice-once when you are at a hospital or clinic, and once when you are not at a hospital or clinic. Record the average of the two measurements. To check your blood pressure when you are not at a hospital or clinic, you can use: ? An automated blood pressure machine  at a pharmacy. ? A home blood pressure monitor.  Talk to your health care provider about your target blood pressure.  If you are between 24-28 years old, ask your health care provider if you should take aspirin to prevent heart disease.  Have regular diabetes screenings by checking your fasting blood sugar level. ? If you are at a normal weight and have a low risk for diabetes, have this test once every three years after the age of 5. ? If you are overweight and have a high risk for diabetes, consider being tested at a younger age or more often.  A one-time screening for abdominal aortic aneurysm (AAA) by ultrasound is recommended for men aged 65-75 years who are current or former smokers. What should I know about preventing infection? Hepatitis B If you have a higher risk for hepatitis B, you should be screened for this virus. Talk with your health care provider to find out if you are at risk for hepatitis B infection. Hepatitis C Blood testing is recommended for:  Everyone born from [birth year] through 1965.  Anyone with known risk factors for hepatitis C. Sexually Transmitted Diseases (STDs)  You should be screened each year for STDs including gonorrhea and chlamydia if: ? You are sexually active and are younger than 38 years of age. ? You are older than 38 years of age and your health care provider tells you that you are at risk for this type of infection. ? Your sexual activity has changed since you were last screened and you are at an increased risk for chlamydia or gonorrhea. Ask your health care provider if you are at risk.  Talk with your health care provider about whether you are at high risk of being infected with HIV. Your health care provider may recommend a prescription medicine to help prevent HIV infection. What else can I do?  Schedule regular health, dental, and eye exams.  Stay current with your vaccines (immunizations).  Do not use any tobacco products, such as  cigarettes, chewing tobacco, and e-cigarettes. If you need help quitting, ask your health care provider.  Limit alcohol intake to no more than 2 drinks per day. One drink equals 12 ounces of beer, 5 ounces of wine, or 1 ounces of hard liquor.  Do not use street drugs.  Do not share needles.  Ask your health care provider for help if you need support or information about quitting drugs.  Tell your health care provider if you often feel depressed.  Tell your health care provider if you have ever been abused or do not feel safe at home. This information is not intended to replace advice given to you by your health care provider. Make sure you discuss any questions you have with your health care provider. Document Released: 07/13/2007 Document Revised: 09/13/2015 Document Reviewed: 10/18/2014 Elsevier Interactive Patient Education  2019 [employer].

## 2018-06-23 NOTE — Progress Notes (Signed)
Subjective:    Patient ID: Stephen Freeman, male    DOB: [birth year], 38 y.o.   MRN: [id number]  HPI He is here for a physical exam.   He states his toe did get better.  He did not end up taking the indomethacin because he was concerned about possible side effects.  The toe improved on its own.  He did review his blood work.  He has no major concerns or questions.  Medications and allergies reviewed with patient and updated if appropriate.  Patient Active Problem List   Diagnosis Date Noted  . Prediabetes 06/23/2018  . Acute gout involving toe of right foot 06/11/2018  . 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 Allergic rhinitis 08/03/2015  . 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 OBESITY, MORBID 06/16/2009  . COMMON MIGRAINE 06/16/2009    No current outpatient medications on file prior to visit.   No current facility-administered medications on file prior to visit.     Past Medical History:  Diagnosis Date  . Abscess of buttock, right   . COMMON MIGRAINE   . GERD   . OBESITY, MORBID     Past Surgical History:  Procedure Laterality Date  . EYE SURGERY    . LASIK  2005    Social History   Socioeconomic History  . Marital status: Married    Spouse name: Not on file  . Number of children: Not on file  . Years of education: Not on file  . Highest education level: Not on file  Occupational History  . Occupation: IT    Employer: [employer]  Social Needs  . Financial resource strain: Not on file  . Food insecurity:    Worry: Not on file    Inability: Not on file  . Transportation needs:    Medical: Not on file    Non-medical: Not on file  Tobacco Use  . Smoking status: Never Smoker  . Smokeless tobacco: Never Used  Substance and Sexual Activity  . Alcohol use: Yes    Alcohol/week: 0.0 standard drinks    Comment: Rarely  . Drug use: No  . Sexual activity: Not Currently  Lifestyle  . Physical activity:    Days per week: Not on file    Minutes per  session: Not on file  . Stress: Not on file  Relationships  . Social connections:    Talks on phone: Not on file    Gets together: Not on file    Attends religious service: Not on file    Active member of club or organization: Not on file    Attends meetings of clubs or organizations: Not on file    Relationship status: Not on file  Other Topics Concern  . Not on file  Social History Narrative   Lives with wife and 2 children     Works at [employer] in [employer] department   Education: masters degree.      Intermittent exercise    Family History  Problem Relation Age of Onset  . Diabetes Mother   . Hypertension Mother   . Alcohol abuse Father   . Stroke Father   . Heart disease Maternal Grandmother   . Diabetes Maternal Grandmother   . Migraines Maternal Grandmother   . Emphysema Maternal Grandfather        smoked  . Parkinson's disease Maternal Grandfather   . Arthritis Other        grandparent  . Healthy Daughter     Review of  Systems  Constitutional: Negative for chills and fever.  Eyes: Negative for visual disturbance.  Respiratory: Negative for cough, shortness of breath and wheezing.   Cardiovascular: Negative for chest pain, palpitations and leg swelling.  Gastrointestinal: Negative for abdominal pain, blood in stool, constipation, diarrhea and nausea.       Occasional GERD  Genitourinary: Negative for difficulty urinating, dysuria and hematuria.  Musculoskeletal: Positive for arthralgias (knees sometimes, left elbow).  Skin: Negative for color change and rash.  Neurological: Negative for light-headedness and headaches.  Psychiatric/Behavioral: Negative for dysphoric mood. The patient is not nervous/anxious.        Objective:   Vitals:   06/24/18 1032  BP: (!) 134/92  Pulse: 69  Resp: 18  Temp: 98.5 F (36.9 C)  SpO2: 98%   Filed Weights   06/24/18 1032  Weight: (!) 361 lb 1.9 oz (163.8 kg)   Body mass index is 51.82 kg/m.   BP Readings from Last 3  Encounters:  06/24/18 (!) 134/92  06/11/18 132/80  04/07/18 118/80     Wt Readings from Last 3 Encounters:  06/24/18 (!) 361 lb 1.9 oz (163.8 kg)  06/11/18 (!) 361 lb (163.7 kg)  04/07/18 (!) 352 lb 6.1 oz (159.8 kg)     Physical Exam Constitutional: He appears well-developed and well-nourished. No distress.  HENT:  Head: Normocephalic and atraumatic.  Right Ear: External ear normal.  Left Ear: External ear normal.  Mouth/Throat: Oropharynx is clear and moist.  Normal ear canals and TM b/l  Eyes: Conjunctivae and EOM are normal.  Neck: Neck supple. No tracheal deviation present. No thyromegaly present.  No carotid bruit  Cardiovascular: Normal rate, regular rhythm, normal heart sounds and intact distal pulses.   No murmur heard. Pulmonary/Chest: Effort normal and breath sounds normal. No respiratory distress. He has no wheezes. He has no rales.  Abdominal: Obese.  Soft. He exhibits no distension. There is no tenderness.  Genitourinary: deferred  Musculoskeletal: He exhibits no edema.  Lymphadenopathy:   He has no cervical adenopathy.  Skin: Skin is warm and dry. He is not diaphoretic.  Psychiatric: He has a normal mood and affect. His behavior is normal.         Assessment & Plan:   Physical exam: Screening blood work   reviewed Immunizations   Up to date  Exercise   - he exercises intermittently Weight   Advised weight loss.  He has not been eating hte best over the past couple of months.  He has ben eating out a lot.  Skin no concerns Substance abuse   none  See Problem List for Assessment and Plan of chronic medical problems.  Follow-up annually

## 2018-06-24 ENCOUNTER — Encounter: Payer: Self-pay | Admitting: Internal Medicine

## 2018-06-24 ENCOUNTER — Ambulatory Visit (INDEPENDENT_AMBULATORY_CARE_PROVIDER_SITE_OTHER): Payer: BC Managed Care – PPO | Admitting: Internal Medicine

## 2018-06-24 ENCOUNTER — Other Ambulatory Visit: Payer: Self-pay

## 2018-06-24 VITALS — BP 134/92 | HR 69 | Temp 98.5°F | Resp 18 | Ht 70.0 in | Wt 361.1 lb

## 2018-06-24 DIAGNOSIS — R7303 Prediabetes: Secondary | ICD-10-CM | POA: Diagnosis not present

## 2018-06-24 DIAGNOSIS — M109 Gout, unspecified: Secondary | ICD-10-CM

## 2018-06-24 DIAGNOSIS — Z0000 Encounter for general adult medical examination without abnormal findings: Secondary | ICD-10-CM

## 2018-06-24 NOTE — Assessment & Plan Note (Signed)
Uric acid level 7.4, ESR normal He likely did have gout Symptoms resolved without taking indomethacin He did take Advil on a couple of occasions Will work on lifestyle changes, specifically weight loss and improving his diet Advised that he can take high-dose NSAIDs if he has another episode

## 2018-06-24 NOTE — Assessment & Plan Note (Signed)
Stressed the importance of weight loss He does have prediabetes and borderline elevated blood pressure Stressed regular exercise Work on [employer] He knows he needs to lose weight and he needs to become more consistent with exercise and improve his diet Follow-up in 1 year, sooner if needed

## 2018-06-24 NOTE — Assessment & Plan Note (Addendum)
New diagnosis Discussed diet changes, importance of exercise and weight loss We will monitor annually for now

## 2019-03-07 ENCOUNTER — Ambulatory Visit (INDEPENDENT_AMBULATORY_CARE_PROVIDER_SITE_OTHER)
Admission: RE | Admit: 2019-03-07 | Discharge: 2019-03-07 | Disposition: A | Discharge status: Home / Self Care | Payer: BC Managed Care – PPO | Source: Ambulatory Visit

## 2019-03-07 DIAGNOSIS — R05 Cough: Secondary | ICD-10-CM | POA: Diagnosis not present

## 2019-03-07 DIAGNOSIS — J019 Acute sinusitis, unspecified: Secondary | ICD-10-CM

## 2019-03-07 DIAGNOSIS — J029 Acute pharyngitis, unspecified: Secondary | ICD-10-CM

## 2019-03-07 MED ORDER — PREDNISONE 50 MG PO TABS: 50.0000 mg | ORAL_TABLET | Freq: Every day | ORAL | 0 refills | Status: DC

## 2019-03-07 MED ORDER — AMOXICILLIN-POT CLAVULANATE 875-125 MG PO TABS: 1.0000 | ORAL_TABLET | Freq: Two times a day (BID) | ORAL | 0 refills | Status: AC

## 2019-03-07 MED ORDER — DM-GUAIFENESIN ER 30-600 MG PO TB12: 1.0000 | ORAL_TABLET | Freq: Two times a day (BID) | ORAL | 0 refills | Status: DC

## 2019-03-07 NOTE — Discharge Instructions (Signed)
Begin Augmentin twice daily for 1 week Prednisone daily for 5 days- take with food and in morning if able Continue cetirizine/zyrtec Add in mucinex DM May try honey and hot tea to help with throat irritation  Follow up in person if not improving

## 2019-03-07 NOTE — ED Provider Notes (Signed)
Virtual Visit via Video Note:  Stephen Freeman  initiated request for Telemedicine visit with [facility] team. I connected with Stephen Freeman  on 03/07/2019 at 1:07 PM  for a synchronized telemedicine visit using a video enabled HIPPA compliant telemedicine application. I verified that I am speaking with Stephen Freeman  using two identifiers. Stephen Hershberger Freeman Larcenia Holaday, PA-Freeman  was physically located in a [facility] Urgent care site and Stephen Freeman was located at a different location.   The limitations of evaluation and management by telemedicine as well as the availability of in-person appointments were discussed. Patient was informed that he  may incur a bill ( including co-pay) for this virtual visit encounter. Stephen Freeman  expressed understanding and gave verbal consent to proceed with virtual visit.     History of Present Illness:Stephen Freeman  is a 39 y.o. male presents for evaluation of URI symptoms and sore throat. Symptoms for 1.5 weeks. Reports congestion, nasal congestion. Son sick, negative COVID. No other exposures. Contestant.  Persistent postnasal drip triggering cough. Reports noticing exudate on tonsil, feels very irritated.  Denies swelling.  Denies fevers, chills, body aches. Reports fatigue. Denies Gi symptoms.  Advil for pain/sore throat. Zyrtec daily.    No Known Allergies   Past Medical History:  Diagnosis Date  . Abscess of buttock, right   . COMMON MIGRAINE   . GERD   . OBESITY, MORBID      Social History   Tobacco Use  . Smoking status: Never Smoker  . Smokeless tobacco: Never Used  Substance Use Topics  . Alcohol use: Yes    Alcohol/week: 0.0 standard drinks    Comment: Rarely  . Drug use: No        Observations/Objective: Physical Exam  Constitutional: He is oriented to person, place, and time. No distress.  HENT:  Head: Normocephalic and atraumatic.  Pulmonary/Chest: Effort normal. No respiratory distress.  Speaking in full sentences, no coughing during  visit  Musculoskeletal:     Cervical back: Normal range of motion.  Neurological: He is alert and oriented to person, place, and time.  Speech clear, face symmetric  Nursing note and vitals reviewed.    Assessment and Plan:    ICD-10-CM   1. Acute sinusitis with symptoms > 10 days  J01.90     URI symptoms x1.5 weeks, will go out and cover for sinusitis as well as tonsillitis with Augmentin twice daily x1 week.  Providing course of prednisone x5 days to further help with inflammation within sinuses and throat.  Continue Zyrtec, adding Mucinex DM for postnasal drainage.  Rest and drink plenty of fluids.  Discussed strict return precautions. Patient verbalized understanding and is agreeable with plan.    Follow Up Instructions:     I discussed the assessment and treatment plan with the patient. The patient was provided an opportunity to ask questions and all were answered. The patient agreed with the plan and demonstrated an understanding of the instructions.   The patient was advised to call back or seek an in-person evaluation if the symptoms worsen or if the condition fails to improve as anticipated.      Stephen Dawes, PA-Freeman  03/07/2019 1:07 PM         Stephen Hammersmith C, PA-Freeman 03/07/19 1307

## 2019-09-06 ENCOUNTER — Other Ambulatory Visit: Payer: Self-pay

## 2019-09-06 ENCOUNTER — Ambulatory Visit (INDEPENDENT_AMBULATORY_CARE_PROVIDER_SITE_OTHER): Payer: BC Managed Care – PPO | Admitting: Family

## 2019-09-06 ENCOUNTER — Ambulatory Visit (INDEPENDENT_AMBULATORY_CARE_PROVIDER_SITE_OTHER): Payer: BC Managed Care – PPO

## 2019-09-06 VITALS — BP 130/74 | HR 79 | Temp 98.1°F | Ht 70.0 in | Wt 355.0 lb

## 2019-09-06 DIAGNOSIS — M79671 Pain in right foot: Secondary | ICD-10-CM

## 2019-09-06 MED ORDER — MELOXICAM 15 MG PO TABS
15.0000 mg | ORAL_TABLET | Freq: Every day | ORAL | 0 refills | Status: DC
Start: 2019-09-06 — End: 2021-11-17

## 2019-09-06 NOTE — Progress Notes (Signed)
Stephen Freeman is a 39 y.o. 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 30 tablet 0   No current facility-administered medications for this visit.    Allergies: Patient has no known allergies.  Past Medical History:  Diagnosis Date   Abscess of buttock, right    COMMON MIGRAINE    GERD    OBESITY, MORBID     Past Surgical History:  Procedure Laterality Date   EYE SURGERY     LASIK  2005    Family History  Problem Relation Age of Onset   Diabetes Mother    Hypertension Mother    Alcohol abuse Father    Stroke Father    Heart disease Maternal Grandmother    Diabetes Maternal Grandmother    Migraines Maternal Grandmother    Emphysema Maternal Grandfather        smoked   Parkinson's disease Maternal Grandfather    Arthritis Other        grandparent   Healthy Daughter     Social History   Tobacco Use   Smoking status: Never Smoker   Smokeless tobacco: Never Used  Substance Use Topics   Alcohol use: Yes    Alcohol/week: 0.0 standard drinks    Comment: Rarely    Subjective:  Right foot pain x 3-4 days; no known injury or trauma; is concerned that he could have fractured the foot; does do kickboxing at his gym but did not go last week; not taking anything for symptoms;    Objective:  Vitals:   09/06/19 1525  BP: 130/74  Pulse: 79  Temp: 98.1 F (36.7 C)  TempSrc: Oral  SpO2: 97%  Weight: (!) 355 lb (161 kg)  Height: 5\' 10"  (1.778 m)    General: Well developed, well nourished, in no acute  distress  Head: Normocephalic and atraumatic  Lungs: Respirations unlabored; clear to auscultation bilaterally without wheeze, rales, rhonchi  Musculoskeletal: No deformities; no active joint inflammation  Extremities: No edema, cyanosis, clubbing  Vessels: Symmetric bilaterally  Neurologic: Alert and oriented; speech intact; face symmetrical; moves all extremities well; CNII-XII intact without focal deficit   Assessment:  1. Right foot pain     Plan:  Will update X-ray as patient is concerned for fracture; trial of Mobic 15 mg daily; encouraged to ice the foot; follow-up to be determined.  This visit occurred during the SARS-CoV-2 public health emergency.  Safety protocols were in place, including screening questions prior to the visit, additional usage of staff PPE, and extensive cleaning of exam room while observing appropriate contact time as indicated for disinfecting solutions.     No follow-ups on file.  Orders Placed This Encounter  Procedures   DG Foot Complete Right    Standing Status:   Future    Standing Expiration Date:   09/05/2020    Order Specific Question:   Reason for Exam (SYMPTOM  OR DIAGNOSIS REQUIRED)    Answer:   right foot pain    Order Specific Question:   Preferred imaging location?  Answer:   [facility]    Order Specific Question:   Radiology Contrast Protocol - do NOT remove file path    Answer:   [email or web address]    Requested Prescriptions   Signed Prescriptions Disp Refills   meloxicam (MOBIC) 15 MG tablet 30 tablet 0    Sig: Take 1 tablet (15 mg total) by mouth daily.

## 2019-11-21 ENCOUNTER — Encounter: Payer: Self-pay | Admitting: Internal Medicine

## 2020-04-30 ENCOUNTER — Encounter: Payer: Self-pay | Admitting: Internal Medicine

## 2020-04-30 DIAGNOSIS — S93409A Sprain of unspecified ligament of unspecified ankle, initial encounter: Secondary | ICD-10-CM

## 2020-05-09 ENCOUNTER — Encounter: Payer: Self-pay | Admitting: Family Medicine

## 2020-10-04 ENCOUNTER — Telehealth: Payer: Self-pay | Admitting: Internal Medicine

## 2020-10-05 ENCOUNTER — Ambulatory Visit: Payer: BC Managed Care – PPO | Admitting: Sports Medicine

## 2020-10-05 ENCOUNTER — Ambulatory Visit (INDEPENDENT_AMBULATORY_CARE_PROVIDER_SITE_OTHER): Payer: BC Managed Care – PPO

## 2020-10-05 ENCOUNTER — Ambulatory Visit: Payer: Self-pay

## 2020-10-05 ENCOUNTER — Other Ambulatory Visit: Payer: Self-pay

## 2020-10-05 ENCOUNTER — Encounter: Payer: Self-pay | Admitting: Sports Medicine

## 2020-10-05 VITALS — BP 122/90 | HR 88 | Ht 70.0 in | Wt 366.0 lb

## 2020-10-05 DIAGNOSIS — M7661 Achilles tendinitis, right leg: Secondary | ICD-10-CM

## 2020-10-05 DIAGNOSIS — M25572 Pain in left ankle and joints of left foot: Secondary | ICD-10-CM

## 2020-10-05 DIAGNOSIS — M25571 Pain in right ankle and joints of right foot: Secondary | ICD-10-CM

## 2020-10-05 DIAGNOSIS — G8929 Other chronic pain: Secondary | ICD-10-CM

## 2020-10-05 MED ORDER — MELOXICAM 15 MG PO TABS: 15.0000 mg | ORAL_TABLET | Freq: Every day | ORAL | 0 refills | Status: DC

## 2020-10-05 NOTE — Progress Notes (Signed)
Stephen Freeman [facility] [address] [place] [address] Phone: [phone]   Assessment and Plan:      1. pain of left ankle -Subacute on chronic, waxing/waning, initial sports medicine visit - Patient's original injury was an inversion ankle sprain, however physical exam today more consistent with distal posterior tibialis tendinosis versus plantar fasciitis - Due to subacute pain, point tenderness over base of first metatarsal, will obtain ankle x-ray today's visit.  We will contact patient if abnormal findings. - Start HEP focused on ankle stabilization/strength.  Offered PT, which patient declines at this time - Start meloxicam 15 mg daily for 2 to 3 weeks.  May be use remaining as needed for pain control.  Tablets #30, refill 0. - Continue physical activity as tolerated.  Recommend aquatic exercise - Recommend wearing comfortable, fitted sneakers with arch support.  No open back shoes. - DG Ankle Complete Left; Future  2. Acute right ankle pain 3. Right Achilles tendinitis -Acute, waxing and waning, initial sports medicine visit - Likely due to compensation for subacute on chronic left ankle pain - [place] LIMITED JOINT SPACE STRUCTURES LOW LEFT(NO LINKED CHARGES); Future    All pertinent previous records reviewed prior to and during visit.    Follow Up: 4 to 6 weeks for reevaluation.  Could consider formal physical therapy versus advanced imaging versus ESI   Subjective:   I, Debbe Odea, am serving as a scribe for Dr. Richardean Sale  Chief Complaint: Bilateral ankle pian   HPI: 40 year old Male presenting with bilateral ankle pain.   10/05/20 Patient states Left ankle pain a couple months ago sprained playing badminton in the yard with his kids. Patient was taking pain medications to help and pain went away and has come back. Describes pain as shooting in nature from arch of foot up medial ankle. Patient states that the R  ankle is now painful from compensating locates pain to achilles tendon that is worse when going down stairs. Patient states that he is unable to go for walks with out having swelling or pain. Patient has been resting or using Ibuprofen to help with pain.     Relevant Historical Information: 20 years ago had bad sprains in bilateral ankle   Additional pertinent review of systems negative.   Current Outpatient Medications:    meloxicam (MOBIC) 15 MG tablet, Take 1 tablet (15 mg total) by mouth daily., Disp: 30 tablet, Rfl: 0   meloxicam (MOBIC) 15 MG tablet, Take 1 tablet (15 mg total) by mouth daily., Disp: 30 tablet, Rfl: 0   Objective:     Vitals:   10/05/20 0852  BP: 122/90  Pulse: 88  SpO2: 98%  Weight: (!) 366 lb (166 kg)  Height: 5\' 10"  (1.778 m)      Body mass index is 52.52 kg/m.    Physical Exam:    Gen: Appears well, nad, nontoxic and pleasant Psych: Alert and oriented, appropriate mood and affect Neuro: sensation intact, strength is 5/5 with df/pf/inv/ev, muscle tone wnl Skin: no susupicious lesions or rashes  Bilateral ankle: Palpable, raised nodule 2 to 3 cm above right Achilles tendon insertion No deformity, swelling, effusion and left ankle Right ankle TTP over Achilles tendon nodule Left ankle TTP base of first metatarsal at plantar aspect Otherwise bilateral ankle NTTP over fibular head, lat mal, medial mal, navicular, base of 5th, ATFL, CFL, deltoid, calcaneous or midfoot ROM DF 30, PF 45, inv/ev intact Negative ant drawer,  talar tilt, rotation test, squeeze test. Neg thompson No pain with resisted inversion or eversion Mild discomfort of right Achilles with resisted plantarflexion  Sports Medicine: Musculoskeletal Ultrasound. Exam: Limited US of bilateral ankles Diagnosis: Bilateral ankle pain  US Findings: Thickening of right Achilles tendon without acute tear.  Intact left plantar fascial without significant swelling or abnormality.   Normal-appearing posterior tibialis, flexor digitorum tendons without significant edema at medial malleolus  US Impression:  Right Achilles tendinosis  Electronically signed by:  Stephen Freeman [facility] 9:42 AM 10/05/20

## 2020-10-05 NOTE — Patient Instructions (Addendum)
Good to see you  Get X-ray of left ankle on the way out Home exercises given try to do 3 times a week  Advanced activity as tolerated Wear sneakers with support no open back shoes Meloxicam sent in take 1 tablet daily for 3 weeks See me again in 4-6 weeks

## 2020-11-01 NOTE — Progress Notes (Signed)
    Stephen Freeman D.[facility] [address] [place] [address] Phone: [phone]   Assessment and Plan:     1. 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  Handout provided and instructions given - Meloxicam 15 mg as needed for pain control.  Refill given  2. Acute left ankle pain -Acute, improved, subsequent visit - Ankle sprain versus posterior tibialis sprain has largely improved since previous office visit - Continue good, supportive foot wear -Meloxicam 15 mg as needed for pain control - Continue HEP   Pertinent previous records reviewed include previous office note   Follow Up: As needed if no improvement or worsening of symptoms   Subjective:   I, Stephen Freeman, am serving as a scribe for Dr. Richardean Sale  Chief Complaint: Right ankle pain follow up   HPI:   10/05/20 Patient states Left ankle pain a couple months ago sprained playing badminton in the yard with his kids. Patient was taking pain medications to help and pain went away and has come back. Describes pain as shooting in nature from arch of foot up medial ankle. Patient states that the R ankle is now painful from compensating locates pain to achilles tendon that is worse when going down stairs. Patient states that he is unable to go for walks with out having swelling or pain. Patient has been resting or using Ibuprofen to help with pain.     11/02/20 Patient states that he is doing better. Stopped Meloxicam last Thursday and the pain started to come back and patient took one yesterday to help with the pain and it helped. More concerned about the R ankle since the tendon is so sore. L ankle still has its days just not as bad  Relevant Historical Information: None pertinent  Additional pertinent  review of systems negative.   Current Outpatient Medications:    meloxicam (MOBIC) 15 MG tablet, Take 1 tablet (15 mg total) by mouth daily., Disp: 30 tablet, Rfl: 0   meloxicam (MOBIC) 15 MG tablet, Take 1 tablet (15 mg total) by mouth daily as needed for pain., Disp: 30 tablet, Rfl: 0   Objective:     Vitals:   11/02/20 0850  BP: 120/90  Pulse: 75  SpO2: 98%  Weight: (!) 366 lb (166 kg)  Height: 5\' 10"  (1.778 m)      Body mass index is 52.52 kg/m.    Physical Exam:    Gen: Appears well, nad, nontoxic and pleasant Psych: Alert and oriented, appropriate mood and affect Neuro: sensation intact, strength is 5/5 with df/pf/inv/ev, muscle tone wnl Skin: no susupicious lesions or rashes  Bilateral ankle: Palpable, raised nodule 2 to 3 cm above right Achilles tendon insertion No deformity, swelling, effusion and left ankle Right ankle TTP over Achilles tendon nodule Otherwise bilateral ankle NTTP over fibular head, lat mal, medial mal, navicular, base of 5th, ATFL, CFL, deltoid, calcaneous or midfoot ROM DF 30, PF 45, inv/ev intact Negative ant drawer, talar tilt, rotation test, squeeze test. Neg thompson No pain with resisted inversion or eversion Mild discomfort of right Achilles with resisted plantarflexion   Electronically signed by:  D[facility] 9:08 AM 11/02/20

## 2020-11-02 ENCOUNTER — Ambulatory Visit: Payer: BC Managed Care – PPO | Admitting: Sports Medicine

## 2020-11-02 ENCOUNTER — Other Ambulatory Visit: Payer: Self-pay

## 2020-11-02 ENCOUNTER — Ambulatory Visit: Payer: Self-pay

## 2020-11-02 VITALS — BP 120/90 | HR 75 | Ht 70.0 in | Wt 366.0 lb

## 2020-11-02 DIAGNOSIS — M25572 Pain in left ankle and joints of left foot: Secondary | ICD-10-CM

## 2020-11-02 DIAGNOSIS — M25571 Pain in right ankle and joints of right foot: Secondary | ICD-10-CM | POA: Diagnosis not present

## 2020-11-02 DIAGNOSIS — M7661 Achilles tendinitis, right leg: Secondary | ICD-10-CM

## 2020-11-02 DIAGNOSIS — G8929 Other chronic pain: Secondary | ICD-10-CM | POA: Diagnosis not present

## 2020-11-02 MED ORDER — MELOXICAM 15 MG PO TABS: 15.0000 mg | ORAL_TABLET | Freq: Every day | ORAL | 0 refills | Status: DC | PRN

## 2020-11-02 NOTE — Patient Instructions (Addendum)
Good to see you  Continue meloxicam as needed refill sent Achillis tendon exercises given  See me again as needed or pain continues or worsens

## 2020-11-13 ENCOUNTER — Telehealth: Payer: BC Managed Care – PPO | Admitting: Family Medicine

## 2020-11-13 DIAGNOSIS — J0140 Acute pansinusitis, unspecified: Secondary | ICD-10-CM | POA: Diagnosis not present

## 2020-11-13 MED ORDER — AMOXICILLIN-POT CLAVULANATE 875-125 MG PO TABS
1.0000 | ORAL_TABLET | Freq: Two times a day (BID) | ORAL | 0 refills | Status: AC
Start: 2020-11-13 — End: 2020-11-20

## 2020-11-13 NOTE — Progress Notes (Signed)

## 2021-02-22 ENCOUNTER — Encounter: Payer: Self-pay | Admitting: Nurse Practitioner

## 2021-02-22 ENCOUNTER — Ambulatory Visit: Payer: BC Managed Care – PPO | Admitting: Nurse Practitioner

## 2021-02-22 ENCOUNTER — Other Ambulatory Visit: Payer: Self-pay

## 2021-02-22 VITALS — BP 138/86 | HR 83 | Temp 98.7°F | Ht 70.0 in | Wt 361.8 lb

## 2021-02-22 DIAGNOSIS — R109 Unspecified abdominal pain: Secondary | ICD-10-CM | POA: Diagnosis not present

## 2021-02-22 LAB — COMPREHENSIVE METABOLIC PANEL
ALT: 35 U/L (ref 0–53)
AST: 26 U/L (ref 0–37)
Albumin: 4.4 g/dL (ref 3.5–5.2)
Alkaline Phosphatase: 54 U/L (ref 39–117)
BUN: 12 mg/dL (ref 6–23)
CO2: 29 mEq/L (ref 19–32)
Calcium: 10.1 mg/dL (ref 8.4–10.5)
Chloride: 102 mEq/L (ref 96–112)
Creatinine, Ser: 1.02 mg/dL (ref 0.40–1.50)
GFR: 91.77 mL/min (ref >60.00)
Glucose, Bld: 95 mg/dL (ref 70–99)
Potassium: 4.4 mEq/L (ref 3.5–5.1)
Sodium: 140 mEq/L (ref 135–145)
Total Bilirubin: 0.5 mg/dL (ref 0.2–1.2)
Total Protein: 8.1 g/dL (ref 6.0–8.3)

## 2021-02-22 LAB — CBC WITH DIFFERENTIAL/PLATELET
Basophils Absolute: 0.1 10*3/uL (ref 0.0–0.1)
Basophils Relative: 0.6 % (ref 0.0–3.0)
Eosinophils Absolute: 0.2 10*3/uL (ref 0.0–0.7)
Eosinophils Relative: 1.8 % (ref 0.0–5.0)
HCT: 40.2 % (ref 39.0–52.0)
Hemoglobin: 13.3 g/dL (ref 13.0–17.0)
Lymphocytes Relative: 18.1 % (ref 12.0–46.0)
Lymphs Abs: 1.7 10*3/uL (ref 0.7–4.0)
MCHC: 33.1 g/dL (ref 30.0–36.0)
MCV: 88.3 fl (ref 78.0–100.0)
Monocytes Absolute: 1.1 10*3/uL — ABNORMAL HIGH (ref 0.1–1.0)
Monocytes Relative: 12.4 % — ABNORMAL HIGH (ref 3.0–12.0)
Neutro Abs: 6.2 10*3/uL (ref 1.4–7.7)
Neutrophils Relative %: 67.1 % (ref 43.0–77.0)
Platelets: 309 10*3/uL (ref 150.0–400.0)
RBC: 4.56 Mil/uL (ref 4.22–5.81)
RDW: 13.3 % (ref 11.5–15.5)
WBC: 9.2 10*3/uL (ref 4.0–10.5)

## 2021-02-22 MED ORDER — OMEPRAZOLE 20 MG PO CPDR: 20.0000 mg | DELAYED_RELEASE_CAPSULE | Freq: Every day | ORAL | 1 refills | Status: DC

## 2021-02-22 NOTE — Progress Notes (Addendum)
Subjective:  Patient ID: Stephen Freeman, male    DOB: April 22, 1980  Age: 41 y.o. MRN: [id number]  CC:  Chief Complaint  Patient presents with   Abdominal Pain      HPI  This patient arrives today for the above.  Symptoms started about 1-1/2 weeks ago.  He reports abdominal pain, fatigue, feeling cold, and diarrhea after taking Ex-Lax Merril Abbe.  Pain is located in the upper left quadrant and some in the lower left quadrant.  Symptoms are now improving.  Pain would wax and wane from 2-3/10 to 7-8/10 intensity.  Today he reports its more in the 2-3/10 range.  He tells me that taking ibuprofen, burping, and Pepcid would help with the pain.  He also experienced constipation which he treated with Ex-Lax and MiraLAX.  He also reported that he recently gave blood when his symptoms started which may be contributing to his fatigue as well.  He denies nausea, vomiting, blood in stool.  He denies fever he did check his temperature orally and it was 98.7.  He reports that he is passing gas and having stools regularly at this time.  He denies pain or difficulty with swallowing.  He does report that he had been traveling recently and has been eating a lot of takeout while traveling.  Past Medical History:  Diagnosis Date   Abscess of buttock, right    COMMON MIGRAINE    GERD    OBESITY, MORBID       Family History  Problem Relation Age of Onset   Diabetes Mother    Hypertension Mother    Alcohol abuse Father    Stroke Father    Heart disease Maternal Grandmother    Diabetes Maternal Grandmother    Migraines Maternal Grandmother    Emphysema Maternal Grandfather        smoked   Parkinson's disease Maternal Grandfather    Arthritis Other        grandparent   Healthy Daughter     Social History   Social History Narrative   Lives with wife and 2 children     Works at [employer] in [place]   Education: masters degree.      Intermittent exercise   Social History   Tobacco Use    Smoking status: Never   Smokeless tobacco: Never  Substance Use Topics   Alcohol use: Yes    Alcohol/week: 0.0 standard drinks    Comment: Rarely     Current Meds  Medication Sig   meloxicam (MOBIC) 15 MG tablet Take 1 tablet (15 mg total) by mouth daily as needed for pain.   omeprazole (PRILOSEC) 20 MG capsule Take 1 capsule (20 mg total) by mouth daily.    ROS:  Review of Systems  Constitutional:  Positive for chills and diaphoresis (possibly from wearing layers at night). Negative for fever.  Respiratory:  Negative for shortness of breath.   Cardiovascular:  Positive for chest pain (felt like reflux). Negative for palpitations.  Gastrointestinal:  Positive for abdominal pain and constipation. Negative for blood in stool, diarrhea, nausea and vomiting.    Objective:   Today's Vitals: BP 138/86 (BP Location: Left Arm, Patient Position: Sitting, Cuff Size: Large)    Pulse 83    Temp 98.7 F (37.1 C) (Oral)    Ht '5\' 10"'  (1.778 m)    Wt (!) 361 lb 12.8 oz (164.1 kg)    SpO2 97%    BMI 51.91 kg/m  Vitals with  BMI 02/22/2021 11/02/2020 10/05/2020  Height '5\' 10"'  '5\' 10"'  '5\' 10"'   Weight 361 lbs 13 oz 366 lbs 366 lbs  BMI 51.91 75.44 92.01  Systolic 007 121 975  Diastolic 86 90 90  Pulse 83 75 88     Physical Exam Vitals reviewed.  Constitutional:      Appearance: Normal appearance.  HENT:     Head: Normocephalic and atraumatic.  Cardiovascular:     Rate and Rhythm: Normal rate and regular rhythm.  Pulmonary:     Effort: Pulmonary effort is normal.     Breath sounds: Normal breath sounds.  Abdominal:     General: Bowel sounds are decreased. There is no distension or abdominal bruit.     Palpations: Abdomen is soft. There is no hepatomegaly, splenomegaly or mass.     Tenderness: There is abdominal tenderness (to deep palpation, mild) in the left upper quadrant and left lower quadrant. There is no guarding or rebound. Negative signs include Murphy's sign.     Hernia: No hernia is  present.  Musculoskeletal:     Cervical back: Neck supple.  Skin:    General: Skin is warm and dry.  Neurological:     Mental Status: He is alert and oriented to person, place, and time.  Psychiatric:        Mood and Affect: Mood normal.        Behavior: Behavior normal.        Thought Content: Thought content normal.        Judgment: Judgment normal.         Assessment and Plan   1. Abdominal pain, unspecified abdominal location      Plan: 1.  Unclear etiology at this time.  It does seem to be getting better, thinking this is most likely reflux versus ulcer.  Low suspicion for cholecystitis, diverticulitis, and/or gastroenteritis.  For now we will check CBC and metabolic panel, and we will also trial omeprazole for 2 weeks.  We will have him follow-up in 2 weeks to see if symptoms are improving. We discussed red flag signs/symptoms such as fever, inability to have a bowel movement or pass gas, severe nausea/vomiting/diarrhea, or severe colicky pain and that if these occur patient should go to the emergency department.  If symptoms persist or worsen would recommend possibly getting imaging studies or referral to gastroenterology.   Tests ordered Orders Placed This Encounter  Procedures   Comp Met (CMET)   CBC with Differential/Platelet      Meds ordered this encounter  Medications   omeprazole (PRILOSEC) 20 MG capsule    Sig: Take 1 capsule (20 mg total) by mouth daily.    Dispense:  30 capsule    Refill:  1    Order Specific Question:   Supervising Provider    Answer:   Binnie Rail [id number]    Patient to follow-up in 2 weeks, or sooner as needed.  Ailene Ards, NP

## 2021-03-09 ENCOUNTER — Ambulatory Visit: Payer: BC Managed Care – PPO | Admitting: Internal Medicine

## 2021-11-17 ENCOUNTER — Telehealth: Payer: BC Managed Care – PPO | Admitting: Physician Assistant

## 2021-11-17 DIAGNOSIS — B9689 Other specified bacterial agents as the cause of diseases classified elsewhere: Secondary | ICD-10-CM | POA: Diagnosis not present

## 2021-11-17 DIAGNOSIS — J019 Acute sinusitis, unspecified: Secondary | ICD-10-CM

## 2021-11-17 MED ORDER — AMOXICILLIN-POT CLAVULANATE 875-125 MG PO TABS: 1.0000 | ORAL_TABLET | Freq: Two times a day (BID) | ORAL | 0 refills | Status: DC

## 2021-11-17 NOTE — Progress Notes (Signed)
Virtual Visit Consent   Stephen Freeman, you are scheduled for a virtual visit with a [facility] provider today. Just as with appointments in the office, your consent must be obtained to participate. Your consent will be active for this visit and any virtual visit you may have with one of our providers in the next 365 days. If you have a MyChart account, a copy of this consent can be sent to you electronically.  As this is a virtual visit, video technology does not allow for your provider to perform a traditional examination. This may limit your provider's ability to fully assess your condition. If your provider identifies any concerns that need to be evaluated in person or the need to arrange testing (such as labs, EKG, etc.), we will make arrangements to do so. Although advances in technology are sophisticated, we cannot ensure that it will always work on either your end or our end. If the connection with a video visit is poor, the visit may have to be switched to a telephone visit. With either a video or telephone visit, we are not always able to ensure that we have a secure connection.  By engaging in this virtual visit, you consent to the provision of healthcare and authorize for your insurance to be billed (if applicable) for the services provided during this visit. Depending on your insurance coverage, you may receive a charge related to this service.  I need to obtain your verbal consent now. Are you willing to proceed with your visit today? Stephen Freeman has provided verbal consent on 11/17/2021 for a virtual visit (video or telephone). Leeanne Rio, [place]  Date: 11/17/2021 8:44 AM  Virtual Visit via Video Note   I, Leeanne Rio, connected with  Stephen Freeman  ([id number], [birth year]) on 11/17/21 at  8:45 AM EDT by a video-enabled telemedicine application and verified that I am speaking with the correct person using two identifiers.  Location: Patient: Virtual Visit Location Patient:  Home Provider: Virtual Visit Location Provider: Home Office   I discussed the limitations of evaluation and management by telemedicine and the availability of in person appointments. The patient expressed understanding and agreed to proceed.    History of Present Illness: Stephen Freeman is a 41 y.o. who identifies as a male who was assigned male at birth, and is being seen today for possible sinusitis. Endorses 3-4 days of increased nasal congestion, sinus pressure and some maxillary sinus pain, today with change in nasal discharge from clear to thick yellow and green. Denies fever, chills. Some chest congestion but only in morning on waking so thinks related to drainage. Denies SOB. Denies recent travel. Is taking Ibuprofen OTC.   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  Resting comfortably at home.  Head is normocephalic, atraumatic.  No labored breathing. Speech is clear and coherent with logical content.  Patient is alert and oriented at baseline.   Assessment and Plan: 1. Acute bacterial sinusitis - amoxicillin-clavulanate (AUGMENTIN) 875-125 MG tablet; Take 1 tablet by mouth 2 (two) times daily.  Dispense: 14 tablet; Refill: 0  Rx Augmentin.  Increase  fluids.  Rest.  Saline nasal spray.  Probiotic.  Mucinex as directed.  Humidifier in bedroom. Flonase OTC and Tylenol Cold and sinus.  Call or return to clinic if symptoms are not improving.   Follow Up Instructions: I discussed the assessment and treatment plan with the patient. The patient was provided an opportunity to ask questions and all were answered. The patient agreed with the plan and demonstrated an understanding of the instructions.  A copy of instructions were sent to the patient via MyChart unless otherwise noted below.   The patient was advised to call back or seek an in-person evaluation if the symptoms worsen or if the condition fails to improve as anticipated.  Time:  I spent 10 minutes with the patient via telehealth technology discussing the above problems/concerns.    Piedad Climes, PA-C

## 2021-11-17 NOTE — Patient Instructions (Signed)
Dennie Bible, thank you for joining Piedad Climes, PA-C for today's virtual visit.  While this provider is not your primary care provider (PCP), if your PCP is located in our provider database this encounter information will be shared with them immediately following your visit.   A [facility] MyChart account gives you access to today's visit and all your visits, tests, and labs performed at [facility] " click here if you don't have a [facility] MyChart account or go to [email or web address]  Consent: (Patient) Stephen Freeman provided verbal consent for this virtual visit at the beginning of the encounter.  Current Medications:  Current Outpatient Medications:    meloxicam (MOBIC) 15 MG tablet, Take 1 tablet (15 mg total) by mouth daily., Disp: 30 tablet, Rfl: 0   meloxicam (MOBIC) 15 MG tablet, Take 1 tablet (15 mg total) by mouth daily as needed for pain., Disp: 30 tablet, Rfl: 0   omeprazole (PRILOSEC) 20 MG capsule, Take 1 capsule (20 mg total) by mouth daily., Disp: 30 capsule, Rfl: 1   Medications ordered in this encounter:  No orders of the defined types were placed in this encounter.    *If you need refills on other medications prior to your next appointment, please contact your pharmacy*  Follow-Up: Call back or seek an in-person evaluation if the symptoms worsen or if the condition fails to improve as anticipated.  [facility] [phone]  Other Instructions Please take antibiotic as directed.  Increase fluid intake.  Use Saline nasal spray.  Take a daily multivitamin. Consider starting Flonase and Tylenol Cold and Sinus OTC.  Place a humidifier in the bedroom.  Please call or return clinic if symptoms are not improving.  Sinusitis Sinusitis is redness, soreness, and swelling (inflammation) of the paranasal sinuses. Paranasal sinuses are air pockets within the bones of your face (beneath the eyes, the middle of the forehead, or above the  eyes). In healthy paranasal sinuses, mucus is able to drain out, and air is able to circulate through them by way of your nose. However, when your paranasal sinuses are inflamed, mucus and air can become trapped. This can allow bacteria and other germs to grow and cause infection. Sinusitis can develop quickly and last only a short time (acute) or continue over a long period (chronic). Sinusitis that lasts for more than 12 weeks is considered chronic.  CAUSES  Causes of sinusitis include: Allergies. Structural abnormalities, such as displacement of the cartilage that separates your nostrils (deviated septum), which can decrease the air flow through your nose and sinuses and affect sinus drainage. Functional abnormalities, such as when the small hairs (cilia) that line your sinuses and help remove mucus do not work properly or are not present. SYMPTOMS  Symptoms of acute and chronic sinusitis are the same. The primary symptoms are pain and pressure around the affected sinuses. Other symptoms include: Upper toothache. Earache. Headache. Bad breath. Decreased sense of smell and taste. A cough, which worsens when you are lying flat. Fatigue. Fever. Thick drainage from your nose, which often is green and may contain pus (purulent). Swelling and warmth over the affected sinuses. DIAGNOSIS  Your caregiver will perform a physical exam. During the exam, your caregiver may: Look in your nose for signs of abnormal growths in your nostrils (nasal polyps). Tap over the affected sinus to check for signs of infection. View the inside of your sinuses (endoscopy) with a special imaging device with a light attached (endoscope), which is inserted  into your sinuses. If your caregiver suspects that you have chronic sinusitis, one or more of the following tests may be recommended: Allergy tests. Nasal culture A sample of mucus is taken from your nose and sent to a lab and screened for bacteria. Nasal cytology A  sample of mucus is taken from your nose and examined by your caregiver to determine if your sinusitis is related to an allergy. TREATMENT  Most cases of acute sinusitis are related to a viral infection and will resolve on their own within 10 days. Sometimes medicines are prescribed to help relieve symptoms (pain medicine, decongestants, nasal steroid sprays, or saline sprays).  However, for sinusitis related to a bacterial infection, your caregiver will prescribe antibiotic medicines. These are medicines that will help kill the bacteria causing the infection.  Rarely, sinusitis is caused by a fungal infection. In theses cases, your caregiver will prescribe antifungal medicine. For some cases of chronic sinusitis, surgery is needed. Generally, these are cases in which sinusitis recurs more than 3 times per year, despite other treatments. HOME CARE INSTRUCTIONS  Drink plenty of water. Water helps thin the mucus so your sinuses can drain more easily. Use a humidifier. Inhale steam 3 to 4 times a day (for example, sit in the bathroom with the shower running). Apply a warm, moist washcloth to your face 3 to 4 times a day, or as directed by your caregiver. Use saline nasal sprays to help moisten and clean your sinuses. Take over-the-counter or prescription medicines for pain, discomfort, or fever only as directed by your caregiver. SEEK IMMEDIATE MEDICAL CARE IF: You have increasing pain or severe headaches. You have nausea, vomiting, or drowsiness. You have swelling around your face. You have vision problems. You have a stiff neck. You have difficulty breathing. MAKE SURE YOU:  Understand these instructions. Will watch your condition. Will get help right away if you are not doing well or get worse. Document Released: 01/14/2005 Document Revised: 04/08/2011 Document Reviewed: 01/29/2011 [facility] Patient Information 2014 [place], [place].    If you have been instructed to have an in-person  evaluation today at a local Urgent Care facility, please use the link below. It will take you to a list of all of our available [facility], including address, phone number and hours of operation. Please do not delay care.  [facility]  If you or a family member do not have a primary care provider, use the link below to schedule a visit and establish care. When you choose a [facility] primary care physician or advanced practice provider, you gain a long-term partner in health. Find a Primary Care Provider  Learn more about [facility]'s in-office and virtual care options: [facility]

## 2021-12-06 ENCOUNTER — Encounter: Payer: Self-pay | Admitting: Internal Medicine

## 2021-12-06 NOTE — Progress Notes (Unsigned)
    Subjective:    Patient ID: Stephen Freeman, male    DOB: 04-01-1980, 41 y.o.   MRN: [id number]      HPI Stephen Freeman is here for  Chief Complaint  Patient presents with   Abscess    Abscess on right buttocks (patient reports same problem 5 years ago)     Abscess right buttock - he first noticed symptoms about 3 days ago.  He has been sitting a lot.  He has had pain.  There is a lump there.  He feels fatigued and achy.  No fevers.  Maybe some chills.  No drainage.     She has ben sitting all day - more recently which makes it worse.  It may be a little better today.     Medications and allergies reviewed with patient and updated if appropriate.  Current Outpatient Medications on File Prior to Visit  Medication Sig Dispense Refill   meloxicam (MOBIC) 15 MG tablet      No current facility-administered medications on file prior to visit.    Review of Systems     Objective:   Vitals:   12/07/21 1043  BP: 136/76  Pulse: 80  Temp: 98.6 F (37 C)  SpO2: 98%   BP Readings from Last 3 Encounters:  12/07/21 136/76  02/22/21 138/86  11/02/20 120/90   Wt Readings from Last 3 Encounters:  12/07/21 (!) 368 lb (166.9 kg)  02/22/21 (!) 361 lb 12.8 oz (164.1 kg)  11/02/20 (!) 366 lb (166 kg)   Body mass index is 52.8 kg/m.    Physical Exam         Assessment & Plan:    See Problem List for Assessment and Plan of chronic medical problems.

## 2021-12-07 ENCOUNTER — Ambulatory Visit: Payer: BC Managed Care – PPO | Admitting: Internal Medicine

## 2021-12-07 ENCOUNTER — Encounter: Payer: Self-pay | Admitting: Internal Medicine

## 2021-12-07 VITALS — BP 136/76 | HR 80 | Temp 98.6°F | Ht 70.0 in | Wt 368.0 lb

## 2021-12-07 DIAGNOSIS — L0231 Cutaneous abscess of buttock: Secondary | ICD-10-CM

## 2021-12-07 MED ORDER — SULFAMETHOXAZOLE-TRIMETHOPRIM 800-160 MG PO TABS: 1.0000 | ORAL_TABLET | Freq: Two times a day (BID) | ORAL | 0 refills | Status: AC

## 2021-12-07 NOTE — Patient Instructions (Addendum)
      Medications changes include :   Bactrim twice daily x 10 day      Return if symptoms worsen or fail to improve.

## 2021-12-08 NOTE — Assessment & Plan Note (Signed)
Acute Symptoms started 3 days ago Has had this several times in the past Has seen surgery and they did not feel treatment was necessary No open wound, drainage at this time Advised warm compresses Start Bactrim DS twice daily x10 days Call or return if no improvement or worsening of symptoms

## 2022-02-11 NOTE — Progress Notes (Unsigned)
Stephen Freeman Phone: [phone]   Assessment and Plan:     There are no diagnoses linked to this encounter.  ***   Pertinent previous records reviewed include ***   Follow Up: ***     Subjective:   I, Stephen Freeman, am serving as a Education administrator for Doctor Glennon Mac  Chief Complaint: right elbow pain    HPI:   02/12/2022 Patient is a 42 year old male complaining of right elbow pain . Patient states   Additional pertinent review of systems negative.   Current Outpatient Medications:    meloxicam (MOBIC) 15 MG tablet, , Disp: , Rfl:    Objective:     There were no vitals filed for this visit.    There is no height or weight on file to calculate BMI.    Physical Exam:    ***   Electronically signed by:  Stephen Mccreedy D.[facility] 12:13 PM 02/11/22

## 2022-02-12 ENCOUNTER — Ambulatory Visit: Payer: BC Managed Care – PPO | Admitting: Sports Medicine

## 2022-02-12 VITALS — HR 90 | Ht 70.0 in | Wt 368.0 lb

## 2022-02-12 DIAGNOSIS — M25521 Pain in right elbow: Secondary | ICD-10-CM

## 2022-02-12 NOTE — Patient Instructions (Addendum)
Good to see you  - Start meloxicam 15 mg daily x2 weeks.  If still having pain after 2 weeks, complete 3rd-week of meloxicam. May use remaining meloxicam as needed once daily for pain control.  Do not to use additional NSAIDs while taking meloxicam.  May use Tylenol [id number] mg 2 to 3 times a day for breakthrough pain. Elbow HEP Recommend not lifting more than 10 pound with right arm  3-4 week follow up

## 2022-03-06 ENCOUNTER — Ambulatory Visit: Payer: BC Managed Care – PPO | Admitting: Sports Medicine

## 2022-03-25 ENCOUNTER — Encounter: Payer: Self-pay | Admitting: Internal Medicine

## 2022-03-25 DIAGNOSIS — L0231 Cutaneous abscess of buttock: Secondary | ICD-10-CM

## 2022-03-28 ENCOUNTER — Encounter: Payer: Self-pay | Admitting: Internal Medicine

## 2022-03-28 NOTE — Progress Notes (Deleted)
    Subjective:    Patient ID: Stephen Freeman, male    DOB: 1981-01-11, 42 y.o.   MRN: [id number]      HPI Olson is here for No chief complaint on file.    Abscess -     Medications and allergies reviewed with patient and updated if appropriate.  Current Outpatient Medications on File Prior to Visit  Medication Sig Dispense Refill   meloxicam (MOBIC) 15 MG tablet      No current facility-administered medications on file prior to visit.    Review of Systems     Objective:  There were no vitals filed for this visit. BP Readings from Last 3 Encounters:  12/07/21 136/76  02/22/21 138/86  11/02/20 120/90   Wt Readings from Last 3 Encounters:  02/12/22 (!) 368 lb (166.9 kg)  12/07/21 (!) 368 lb (166.9 kg)  02/22/21 (!) 361 lb 12.8 oz (164.1 kg)   There is no height or weight on file to calculate BMI.    Physical Exam         Assessment & Plan:    See Problem List for Assessment and Plan of chronic medical problems.

## 2022-03-29 ENCOUNTER — Ambulatory Visit (INDEPENDENT_AMBULATORY_CARE_PROVIDER_SITE_OTHER): Payer: BC Managed Care – PPO | Admitting: Internal Medicine

## 2022-03-29 VITALS — BP 128/78 | HR 78 | Temp 98.6°F | Ht 70.0 in | Wt 381.0 lb

## 2022-03-29 DIAGNOSIS — Z1159 Encounter for screening for other viral diseases: Secondary | ICD-10-CM

## 2022-03-29 DIAGNOSIS — L0231 Cutaneous abscess of buttock: Secondary | ICD-10-CM | POA: Diagnosis not present

## 2022-03-29 DIAGNOSIS — R7303 Prediabetes: Secondary | ICD-10-CM | POA: Diagnosis not present

## 2022-03-29 DIAGNOSIS — Z0000 Encounter for general adult medical examination without abnormal findings: Secondary | ICD-10-CM | POA: Diagnosis not present

## 2022-03-29 LAB — CBC WITH DIFFERENTIAL/PLATELET
Basophils Absolute: 0.1 10*3/uL (ref 0.0–0.1)
Basophils Relative: 1.2 % (ref 0.0–3.0)
Eosinophils Absolute: 0.2 10*3/uL (ref 0.0–0.7)
Eosinophils Relative: 2.8 % (ref 0.0–5.0)
HCT: 44.7 % (ref 39.0–52.0)
Hemoglobin: 15.3 g/dL (ref 13.0–17.0)
Lymphocytes Relative: 28.7 % (ref 12.0–46.0)
Lymphs Abs: 1.7 10*3/uL (ref 0.7–4.0)
MCHC: 34.2 g/dL (ref 30.0–36.0)
MCV: 88.7 fl (ref 78.0–100.0)
Monocytes Absolute: 0.7 10*3/uL (ref 0.1–1.0)
Monocytes Relative: 11.5 % (ref 3.0–12.0)
Neutro Abs: 3.4 10*3/uL (ref 1.4–7.7)
Neutrophils Relative %: 55.8 % (ref 43.0–77.0)
Platelets: 278 10*3/uL (ref 150.0–400.0)
RBC: 5.04 Mil/uL (ref 4.22–5.81)
RDW: 13.4 % (ref 11.5–15.5)
WBC: 6.1 10*3/uL (ref 4.0–10.5)

## 2022-03-29 LAB — LIPID PANEL
Cholesterol: 167 mg/dL (ref 0–200)
HDL: 48.8 mg/dL (ref >39.00)
LDL Cholesterol: 103 mg/dL — ABNORMAL HIGH (ref 0–99)
NonHDL: 117.89
Total CHOL/HDL Ratio: 3
Triglycerides: 75 mg/dL (ref 0.0–149.0)
VLDL: 15 mg/dL (ref 0.0–40.0)

## 2022-03-29 LAB — COMPREHENSIVE METABOLIC PANEL
ALT: 53 U/L (ref 0–53)
AST: 33 U/L (ref 0–37)
Albumin: 4.2 g/dL (ref 3.5–5.2)
Alkaline Phosphatase: 53 U/L (ref 39–117)
BUN: 14 mg/dL (ref 6–23)
CO2: 27 mEq/L (ref 19–32)
Calcium: 9.7 mg/dL (ref 8.4–10.5)
Chloride: 104 mEq/L (ref 96–112)
Creatinine, Ser: 0.98 mg/dL (ref 0.40–1.50)
GFR: 95.54 mL/min (ref >60.00)
Glucose, Bld: 95 mg/dL (ref 70–99)
Potassium: 4.3 mEq/L (ref 3.5–5.1)
Sodium: 138 mEq/L (ref 135–145)
Total Bilirubin: 0.5 mg/dL (ref 0.2–1.2)
Total Protein: 7.1 g/dL (ref 6.0–8.3)

## 2022-03-29 LAB — HEMOGLOBIN A1C: Hgb A1c MFr Bld: 5.9 % (ref 4.6–6.5)

## 2022-03-29 LAB — TSH: TSH: 1.82 u[IU]/mL (ref 0.35–5.50)

## 2022-03-29 MED ORDER — AMOXICILLIN-POT CLAVULANATE 875-125 MG PO TABS: 1.0000 | ORAL_TABLET | Freq: Two times a day (BID) | ORAL | 0 refills | Status: AC

## 2022-03-29 NOTE — Progress Notes (Signed)
Subjective:    Patient ID: Stephen Freeman, male    DOB: [birth year], 42 y.o.   MRN: [id number]     HPI Stephen Freeman is here for a physical exam.   Abscess R buttock has recurred warm more like it never completely went away.  He did not end up having the abscess drained after I saw him in November.  Since then he continues to have a sore there that is not painful, but there is minimal drainage.  He did see surgery years ago and at that time they did not think anything had to be done for it.   Having some anxiety recently - he is seeing a therapist.  He knows he needs to get his weight down-he has started to be more serious about that and make exercise more regular as well as changing his eating habits.   Medications and allergies reviewed with patient and updated if appropriate.  Current Outpatient Medications on File Prior to Visit  Medication Sig Dispense Refill   meloxicam (MOBIC) 15 MG tablet      No current facility-administered medications on file prior to visit.    Review of Systems  Constitutional:  Negative for fever.  Eyes:  Negative for visual disturbance.  Respiratory:  Negative for cough, shortness of breath and wheezing.   Cardiovascular:  Positive for palpitations (with anxiety). Negative for chest pain and leg swelling.  Gastrointestinal:  Negative for abdominal pain, blood in stool, constipation and diarrhea.       No gerd  Genitourinary:  Negative for difficulty urinating, dysuria and hematuria.  Musculoskeletal:  Negative for arthralgias (occ knee pain) and back pain.       Right elbow pain, legs are achy at times - muscles  Skin:  Negative for rash.  Neurological:  Positive for headaches (occ migraine). Negative for dizziness and light-headedness.  Psychiatric/Behavioral:  Negative for dysphoric mood. The patient is nervous/anxious.        Objective:   Vitals:   03/29/22 0957  BP: (!) 140/88  Pulse: 78  Temp: 98.6 F (37 C)  SpO2: 98%   Filed Weights    03/29/22 0957  Weight: (!) 381 lb (172.8 kg)   Body mass index is 54.67 kg/m.  BP Readings from Last 3 Encounters:  03/29/22 (!) 140/88  12/07/21 136/76  02/22/21 138/86    Wt Readings from Last 3 Encounters:  03/29/22 (!) 381 lb (172.8 kg)  02/12/22 (!) 368 lb (166.9 kg)  12/07/21 (!) 368 lb (166.9 kg)      Physical Exam Constitutional: He appears well-developed and well-nourished. No distress.  HENT:  Head: Normocephalic and atraumatic.  Right Ear: External ear normal.  Left Ear: External ear normal.  Mouth/Throat: Oropharynx is clear and moist.  Normal ear canals and TM b/l  Eyes: Conjunctivae and EOM are normal.  Neck: Neck supple. No tracheal deviation present. No thyromegaly present.  No carotid bruit  Cardiovascular: Normal rate, regular rhythm, normal heart sounds and intact distal pulses.   No murmur heard. Pulmonary/Chest: Effort normal and breath sounds normal. No respiratory distress. He has no wheezes. He has no rales.  Abdominal: Soft. He exhibits no distension. There is no tenderness.  Genitourinary: deferred  Musculoskeletal: He exhibits no edema.  Lymphadenopathy:   He has no cervical adenopathy.  Skin: Skin is warm and dry. He is not diaphoretic. 3 mm open wound with minimal drainage w/o pain, erythema, swelling or induraton Psychiatric: He has a normal mood and affect. His  behavior is normal.         Assessment & Plan:   Physical exam: Screening blood work  ordered Exercise   - not great - getting better - doing some walking Weight  morbidly obese - stressed weight loss -he understands how important it is for him to lose weight and is dedicated to make that happen.  He knows what he needs to do for that. Substance abuse   none   Reviewed recommended immunizations.  Tdap today   Health Maintenance  Topic Date Due   Hepatitis C Screening  Never done   DTaP/Tdap/Td (3 - Td or Tdap) 02/13/2021   INFLUENZA VACCINE  08/28/2021   COVID-19  Vaccine (1) 04/14/2022 (Originally 11/16/1980)   HIV Screening  Completed   HPV VACCINES  Aged Out     See Problem List for Assessment and Plan of chronic medical problems.

## 2022-03-29 NOTE — Assessment & Plan Note (Signed)
Recurrent He had a flare back in November 2023, which was treated with incision and drainage and antibiotics-states it never really went away Has an open wound that is draining minimally, but nontender Concerns for fistula/cyst Will treat with Augmentin 875-125 mg twice daily x 10 days If this does not resolve or allow this to heal need to consider having him see surgery again for reevaluation-May need procedure to get this to heal

## 2022-03-29 NOTE — Assessment & Plan Note (Signed)
Chronic Check a1c Low sugar / carb diet Stressed regular exercise Stressed weight loss

## 2022-03-29 NOTE — Patient Instructions (Addendum)
Blood work was ordered.   The lab is on the first floor.    Medications changes include :   Augmentin      Return in about 1 year (around 03/29/2023) for Physical Exam.    Health Maintenance, Male Adopting a healthy lifestyle and getting preventive care are important in promoting health and wellness. Ask your health care provider about: The right schedule for you to have regular tests and exams. Things you can do on your own to prevent diseases and keep yourself healthy. What should I know about diet, weight, and exercise? Eat a healthy diet  Eat a diet that includes plenty of vegetables, fruits, low-fat dairy products, and lean protein. Do not eat a lot of foods that are high in solid fats, added sugars, or sodium. Maintain a healthy weight Body mass index (BMI) is a measurement that can be used to identify possible weight problems. It estimates body fat based on height and weight. Your health care provider can help determine your BMI and help you achieve or maintain a healthy weight. Get regular exercise Get regular exercise. This is one of the most important things you can do for your health. Most adults should: Exercise for at least 150 minutes each week. The exercise should increase your heart rate and make you sweat (moderate-intensity exercise). Do strengthening exercises at least twice a week. This is in addition to the moderate-intensity exercise. Spend less time sitting. Even light physical activity can be beneficial. Watch cholesterol and blood lipids Have your blood tested for lipids and cholesterol at 42 years of age, then have this test every 5 years. You may need to have your cholesterol levels checked more often if: Your lipid or cholesterol levels are high. You are older than 42 years of age. You are at high risk for heart disease. What should I know about cancer screening? Many types of cancers can be detected early and may often be prevented. Depending  on your health history and family history, you may need to have cancer screening at various ages. This may include screening for: Colorectal cancer. Prostate cancer. Skin cancer. Lung cancer. What should I know about heart disease, diabetes, and high blood pressure? Blood pressure and heart disease High blood pressure causes heart disease and increases the risk of stroke. This is more likely to develop in people who have high blood pressure readings or are overweight. Talk with your health care provider about your target blood pressure readings. Have your blood pressure checked: Every 3-5 years if you are 21-29 years of age. Every year if you are 84 years old or older. If you are between the ages of 33 and 6 and are a current or former smoker, ask your health care provider if you should have a one-time screening for abdominal aortic aneurysm (AAA). Diabetes Have regular diabetes screenings. This checks your fasting blood sugar level. Have the screening done: Once every three years after age 30 if you are at a normal weight and have a low risk for diabetes. More often and at a younger age if you are overweight or have a high risk for diabetes. What should I know about preventing infection? Hepatitis B If you have a higher risk for hepatitis B, you should be screened for this virus. Talk with your health care provider to find out if you are at risk for hepatitis B infection. Hepatitis C Blood testing is recommended for: Everyone born from [birth year] through 1965. Anyone with  known risk factors for hepatitis C. Sexually transmitted infections (STIs) You should be screened each year for STIs, including gonorrhea and chlamydia, if: You are sexually active and are younger than 42 years of age. You are older than 42 years of age and your health care provider tells you that you are at risk for this type of infection. Your sexual activity has changed since you were last screened, and you are at  increased risk for chlamydia or gonorrhea. Ask your health care provider if you are at risk. Ask your health care provider about whether you are at high risk for HIV. Your health care provider may recommend a prescription medicine to help prevent HIV infection. If you choose to take medicine to prevent HIV, you should first get tested for HIV. You should then be tested every 3 months for as long as you are taking the medicine. Follow these instructions at home: Alcohol use Do not drink alcohol if your health care provider tells you not to drink. If you drink alcohol: Limit how much you have to 0-2 drinks a day. Know how much alcohol is in your drink. In the [place] one drink equals one 12 oz bottle of beer (355 mL), one 5 oz glass of wine (148 mL), or one 1 oz glass of hard liquor (44 mL). Lifestyle Do not use any products that contain nicotine or tobacco. These products include cigarettes, chewing tobacco, and vaping devices, such as e-cigarettes. If you need help quitting, ask your health care provider. Do not use street drugs. Do not share needles. Ask your health care provider for help if you need support or information about quitting drugs. General instructions Schedule regular health, dental, and eye exams. Stay current with your vaccines. Tell your health care provider if: You often feel depressed. You have ever been abused or do not feel safe at home. Summary Adopting a healthy lifestyle and getting preventive care are important in promoting health and wellness. Follow your health care provider's instructions about healthy diet, exercising, and getting tested or screened for diseases. Follow your health care provider's instructions on monitoring your cholesterol and blood pressure. This information is not intended to replace advice given to you by your health care provider. Make sure you discuss any questions you have with your health care provider. Document Revised: 06/05/2020  Document Reviewed: 06/05/2020 Elsevier Patient Education  [place].

## 2022-03-29 NOTE — Assessment & Plan Note (Signed)
Chronic BMI 54 He agrees that he needs to lose weight and has come to a point in his life where he knows he needs to be serious about it He knows what he needs to do for the weight loss Discussed regular exercise, decrease portions, healthy diet Discussed nutrition referral Discussed medications that would help

## 2022-03-30 LAB — HEPATITIS C ANTIBODY: Hepatitis C Ab: NONREACTIVE

## 2022-04-15 MED ORDER — SULFAMETHOXAZOLE-TRIMETHOPRIM 800-160 MG PO TABS: 1.0000 | ORAL_TABLET | Freq: Two times a day (BID) | ORAL | 0 refills | Status: AC

## 2022-04-15 NOTE — Addendum Note (Signed)
Addended by: Binnie Rail on: 04/15/2022 09:08 PM   Modules accepted: Orders

## 2022-05-01 ENCOUNTER — Telehealth: Payer: BC Managed Care – PPO | Admitting: Physician Assistant

## 2022-05-01 DIAGNOSIS — G510 Bell's palsy: Secondary | ICD-10-CM

## 2022-05-01 MED ORDER — VALACYCLOVIR HCL 1 G PO TABS: 1000.0000 mg | ORAL_TABLET | Freq: Three times a day (TID) | ORAL | 0 refills | Status: AC

## 2022-05-01 MED ORDER — PREDNISONE 10 MG PO TABS: ORAL_TABLET | ORAL | 0 refills | Status: DC

## 2022-05-01 NOTE — Progress Notes (Signed)
Virtual Visit Consent   Stephen Freeman, you are scheduled for a virtual visit with a [facility] provider today. Just as with appointments in the office, your consent must be obtained to participate. Your consent will be active for this visit and any virtual visit you may have with one of our providers in the next 365 days. If you have a MyChart account, a copy of this consent can be sent to you electronically.  As this is a virtual visit, video technology does not allow for your provider to perform a traditional examination. This may limit your provider's ability to fully assess your condition. If your provider identifies any concerns that need to be evaluated in person or the need to arrange testing (such as labs, EKG, etc.), we will make arrangements to do so. Although advances in technology are sophisticated, we cannot ensure that it will always work on either your end or our end. If the connection with a video visit is poor, the visit may have to be switched to a telephone visit. With either a video or telephone visit, we are not always able to ensure that we have a secure connection.  By engaging in this virtual visit, you consent to the provision of healthcare and authorize for your insurance to be billed (if applicable) for the services provided during this visit. Depending on your insurance coverage, you may receive a charge related to this service.  I need to obtain your verbal consent now. Are you willing to proceed with your visit today? Render Matheus has provided verbal consent on 05/01/2022 for a virtual visit (video or telephone). Mar Daring, PA-C  Date: 05/01/2022 8:04 PM  Virtual Visit via Video Note   I, Mar Daring, connected with  Stephen Freeman  ([id number], 08-Oct-1980) on 05/01/22 at  7:30 PM EDT by a video-enabled telemedicine application and verified that I am speaking with the correct person using two identifiers.  Location: Patient: Virtual Visit Location Patient:  Home Provider: Virtual Visit Location Provider: Home Office   I discussed the limitations of evaluation and management by telemedicine and the availability of in person appointments. The patient expressed understanding and agreed to proceed.    History of Present Illness: Stephen Freeman is a 42 y.o. who identifies as a male who was assigned male at birth, and is being seen today for possible Bell's Palsy. 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 He then realized the right side of his face felt a little tingly and numb like. He reports still having the droop of the right side of his mouth, inability to close his mouth completely on that side when blowing out his cheeks, a weird feeling over his cheek, mild pain in the right ear, mild hearing changes, and a pain in the right side of the neck to back of the head. There is no rash present currently. He is able to close his eye. He can wrinkle his forehead. He has no numbness, tingling or weakness on one side of the body or the other. He is able to hold both arms up and equal for over 10 seconds. He has no confusion. No slurred speech. No vision changes. No headache. No dizziness. Has been under some increased stress recently.   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   Abscess of buttock, right 01/17/2011   OBESITY, MORBID 06/16/2009   COMMON MIGRAINE 06/16/2009    Allergies:  Allergies  Allergen Reactions   Nickel Dermatitis and Rash   Medications:  Current Outpatient Medications:    predniSONE (DELTASONE) 10 MG tablet, 60 mg PO once daily for 5 days, then reduce dose by 10  mg/day every day for 5 days for a total treatment duration of 10 days, Disp: 45 tablet, Rfl: 0   valACYclovir (VALTREX) 1000 MG tablet, Take 1 tablet (1,000 mg total) by mouth 3 (three) times daily for 10 days., Disp: 21 tablet, Rfl: 0   meloxicam (MOBIC) 15 MG tablet, , Disp: , Rfl:   Observations/Objective: Patient is well-developed, well-nourished in no acute distress.  Resting comfortably at home.  Head is normocephalic, atraumatic.  No labored breathing.  Speech is clear and coherent with logical content.  Patient is alert and oriented at baseline.  Right sided mouth droop noted, obvious with smiling. Able to still feel light touch to right cheek. Can blink eye. Can wrinkle forehead.   Assessment and Plan: 1. Bell's palsy - valACYclovir (VALTREX) 1000 MG tablet; Take 1 tablet (1,000 mg total) by mouth 3 (three) times daily for 10 days.  Dispense: 21 tablet; Refill: 0 - predniSONE (DELTASONE) 10 MG tablet; 60 mg PO once daily for 5 days, then reduce dose by 10 mg/day every day for 5 days for a total treatment duration of 10 days  Dispense: 45 tablet; Refill: 0  - Highly likely to be Bell's Palsy - Start Prednisone and Valtrex tonight - Strict precautions verbally discussed for emergency evaluation - Follow up with PCP in next week  Follow Up Instructions: I discussed the assessment and treatment plan with the patient. The patient was provided an opportunity to ask questions and all were answered. The patient agreed with the plan and demonstrated an understanding of the instructions.  A copy of instructions were sent to the patient via MyChart unless otherwise noted below.    The patient was advised to call back or seek an in-person evaluation if the symptoms worsen or if the condition fails to improve as anticipated.  Time:  I spent 15 minutes with the patient via telehealth technology discussing the above problems/concerns.    Mar Daring, PA-C

## 2022-05-01 NOTE — Patient Instructions (Signed)
Stephen Freeman, thank you for joining Mar Daring, PA-C for today's virtual visit.  While this provider is not your primary care provider (PCP), if your PCP is located in our provider database this encounter information will be shared with them immediately following your visit.   [facility] account gives you access to today's visit and all your visits, tests, and labs performed at [facility] " click here if you don't have a [facility] account or go to [email or web address]  Consent: (Patient) Stephen Freeman provided verbal consent for this virtual visit at the beginning of the encounter.  Current Medications:  Current Outpatient Medications:    predniSONE (DELTASONE) 10 MG tablet, 60 mg PO once daily for 5 days, then reduce dose by 10 mg/day every day for 5 days for a total treatment duration of 10 days, Disp: 45 tablet, Rfl: 0   valACYclovir (VALTREX) 1000 MG tablet, Take 1 tablet (1,000 mg total) by mouth 3 (three) times daily for 10 days., Disp: 21 tablet, Rfl: 0   meloxicam (MOBIC) 15 MG tablet, , Disp: , Rfl:    Medications ordered in this encounter:  Meds ordered this encounter  Medications   valACYclovir (VALTREX) 1000 MG tablet    Sig: Take 1 tablet (1,000 mg total) by mouth 3 (three) times daily for 10 days.    Dispense:  21 tablet    Refill:  0    Order Specific Question:   Supervising Provider    Answer:   Chase Picket [id number]   predniSONE (DELTASONE) 10 MG tablet    Sig: 60 mg PO once daily for 5 days, then reduce dose by 10 mg/day every day for 5 days for a total treatment duration of 10 days    Dispense:  45 tablet    Refill:  0    Order Specific Question:   Supervising Provider    Answer:   Chase Picket [id number]     *If you need refills on other medications prior to your next appointment, please contact your pharmacy*  Follow-Up: Call back or seek an in-person evaluation if the symptoms worsen or if the condition  fails to improve as anticipated.  [facility] [phone]  Other Instructions  Bell's Palsy, Adult  Bell's palsy is a short-term inability to move muscles in a part of the face. The inability to move, also called paralysis, results from inflammation or compression of the seventh cranial nerve. This nerve travels along the skull and under the ear to the side of the face. This nerve is responsible for facial movements that include blinking, closing the eyes, smiling, and frowning. What are the causes? The exact cause of this condition is not known. It may be caused by an infection from a virus, such as the chickenpox (herpes zoster), Epstein-Barr, or mumps virus. What increases the risk? You are more likely to develop this condition if: You are pregnant. You have diabetes. You have had a recent infection in your nose, throat, or airways. You have a weakened body defense system (immune system). You have had a facial injury, such as a fracture. You have a family history of Bell's palsy. What are the signs or symptoms? Symptoms of this condition include: Weakness on one side of the face. Drooping eyelid and corner of the mouth. Excessive tearing in one eye. Difficulty closing the eyelid. Dry eye. Drooling. Dry mouth. Changes in taste. Change in facial appearance. Pain behind one ear. Ringing in one  or both ears. Sensitivity to sound in one ear. Facial twitching. Headache. Impaired speech. Dizziness. Difficulty eating or drinking. Most of the time, only one side of the face is affected. In rare cases, Bell's palsy may affect the whole face. How is this diagnosed? This condition is diagnosed based on: Your symptoms. Your medical history. A physical exam. You may also have to see health care providers who specialize in disorders of the nerves (neurologist) or diseases and conditions of the eye (ophthalmologist). You may have tests, such as: A test to check for  nerve damage (electromyogram). Imaging studies, such as a CT scan or an MRI. Blood tests. How is this treated? This condition affects every person differently. Sometimes symptoms go away without treatment within a couple weeks. If treatment is needed, it varies from person to person. The goal of treatment is to reduce inflammation and protect the eye from damage. Treatment for Bell's palsy may include: Medicines, such as: Steroids to reduce swelling and inflammation. Antiviral medicines. Pain relievers, including aspirin, acetaminophen, or ibuprofen. Eye drops or ointment to keep your eye moist. Eye protection, if you cannot close your eye. Exercises or massage to regain muscle strength and function (physical therapy). Follow these instructions at home:  Take over-the-counter and prescription medicines only as told by your health care provider. If your eye is affected: Keep your eye moist with eye drops or ointment as told by your health care provider. Follow instructions for eye care and protection as told by your health care provider. Do any physical therapy exercises as told by your health care provider. Keep all follow-up visits. This is important. Contact a health care provider if: You have a fever or chills. Your symptoms do not get better within 2-3 weeks, or your symptoms get worse. Your eye is red, irritated, or painful. You have new symptoms. Get help right away if: You have weakness or numbness in a part of your body other than your face. You have trouble swallowing. You develop neck pain or stiffness. You develop dizziness or shortness of breath. Summary Bell's palsy is a short-term inability to move muscles in a part of the face. The inability to move results from inflammation or compression of the facial nerve. This condition affects every person differently. Sometimes symptoms go away without treatment within a couple weeks. If treatment is needed, it varies from  person to person. The goal of treatment is to reduce inflammation and protect the eye from damage. Contact your health care provider if your symptoms do not get better within 2-3 weeks, or your symptoms get worse. This information is not intended to replace advice given to you by your health care provider. Make sure you discuss any questions you have with your health care provider. Document Revised: 10/14/2019 Document Reviewed: 10/14/2019 Elsevier Patient Education  [employer].    If you have been instructed to have an in-person evaluation today at a local Urgent Care facility, please use the link below. It will take you to a list of all of our available [facility], including address, phone number and hours of operation. Please do not delay care.  [facility]  If you or a family member do not have a primary care provider, use the link below to schedule a visit and establish care. When you choose a [facility] primary care physician or advanced practice provider, you gain a long-term partner in health. Find a Primary Care Provider  Learn more about [facility]'s in-office and virtual care options: [facility]

## 2022-05-02 ENCOUNTER — Encounter: Payer: Self-pay | Admitting: Physician Assistant

## 2022-05-02 ENCOUNTER — Encounter: Payer: Self-pay | Admitting: Internal Medicine

## 2022-05-02 NOTE — Progress Notes (Unsigned)
    Subjective:    Patient ID: Stephen Freeman, male    DOB: May 26, 1980, 42 y.o.   MRN: [id number]      HPI Stephen Freeman is here for No chief complaint on file.   2 days ago he noticed the left side of his face was feeling funny.  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  He then realized the right side of his face felt tingly/numb.  He did a VV 2 days ago and still had the droop of the right side of his mouth, was unable to close his mouth completely when blowing out his cheeks, a weird feeling over his cheek, mild pain in his right ear, mild hearing changes, pain in the right side of his neck to the back of the head.  No rash,  was able to close his eye, can wrinkle his forehead.  No N/T or weakness on one side of his body or the other.  He was able to hold both arms up.  No confusion. No slurred speech. No vision change. No HA or dizziness.    Dx Bell's palsy - ? Shingles - rx'd valtrex 1 gm tid x 7 days, prednisone 60 mg qd x 5 days, then dec by 10 mg daily x 5 days.  .    Today pain in right side of neck and jaw area.  Smile is crooked.  Can close right eye but can not close it tightly.  Less muscle control on right side.  Hearing on right sounds different.     Medications and allergies reviewed with patient and updated if appropriate.  Current Outpatient Medications on File Prior to Visit  Medication Sig Dispense Refill   meloxicam (MOBIC) 15 MG tablet      predniSONE (DELTASONE) 10 MG tablet 60 mg PO once daily for 5 days, then reduce dose by 10 mg/day every day for 5 days for a total treatment duration of 10 days 45 tablet 0   valACYclovir (VALTREX) 1000 MG tablet Take 1 tablet (1,000 mg total) by mouth 3 (three) times daily for 10 days. 21 tablet 0   No current facility-administered medications on file prior to visit.    Review of Systems     Objective:  There were no vitals filed for this visit. BP Readings from Last 3  Encounters:  03/29/22 128/78  12/07/21 136/76  02/22/21 138/86   Wt Readings from Last 3 Encounters:  03/29/22 (!) 381 lb (172.8 kg)  02/12/22 (!) 368 lb (166.9 kg)  12/07/21 (!) 368 lb (166.9 kg)   There is no height or weight on file to calculate BMI.    Physical Exam         Assessment & Plan:    See Problem List for Assessment and Plan of chronic medical problems.

## 2022-05-03 ENCOUNTER — Ambulatory Visit: Payer: BC Managed Care – PPO | Admitting: Internal Medicine

## 2022-05-03 ENCOUNTER — Encounter: Payer: Self-pay | Admitting: Internal Medicine

## 2022-05-03 VITALS — BP 120/74 | HR 80 | Temp 98.5°F | Ht 70.0 in | Wt 380.0 lb

## 2022-05-03 DIAGNOSIS — M542 Cervicalgia: Secondary | ICD-10-CM | POA: Diagnosis not present

## 2022-05-03 DIAGNOSIS — G510 Bell's palsy: Secondary | ICD-10-CM

## 2022-05-03 MED ORDER — GABAPENTIN 300 MG PO CAPS: 300.0000 mg | ORAL_CAPSULE | Freq: Every day | ORAL | 3 refills | Status: DC

## 2022-05-03 NOTE — Patient Instructions (Addendum)
Medications changes include :   gabapentin 300 mg at bedtime     Return if symptoms worsen or fail to improve.    Bell's Palsy, Adult  Bell's palsy is a short-term inability to move muscles in a part of the face. The inability to move, also called paralysis, results from inflammation or compression of the seventh cranial nerve. This nerve travels along the skull and under the ear to the side of the face. This nerve is responsible for facial movements that include blinking, closing the eyes, smiling, and frowning. What are the causes? The exact cause of this condition is not known. It may be caused by an infection from a virus, such as the chickenpox (herpes zoster), Epstein-Barr, or mumps virus. What increases the risk? You are more likely to develop this condition if: You are pregnant. You have diabetes. You have had a recent infection in your nose, throat, or airways. You have a weakened body defense system (immune system). You have had a facial injury, such as a fracture. You have a family history of Bell's palsy. What are the signs or symptoms? Symptoms of this condition include: Weakness on one side of the face. Drooping eyelid and corner of the mouth. Excessive tearing in one eye. Difficulty closing the eyelid. Dry eye. Drooling. Dry mouth. Changes in taste. Change in facial appearance. Pain behind one ear. Ringing in one or both ears. Sensitivity to sound in one ear. Facial twitching. Headache. Impaired speech. Dizziness. Difficulty eating or drinking. Most of the time, only one side of the face is affected. In rare cases, Bell's palsy may affect the whole face. How is this diagnosed? This condition is diagnosed based on: Your symptoms. Your medical history. A physical exam. You may also have to see health care providers who specialize in disorders of the nerves (neurologist) or diseases and conditions of the eye (ophthalmologist). You may have tests,  such as: A test to check for nerve damage (electromyogram). Imaging studies, such as a CT scan or an MRI. Blood tests. How is this treated? This condition affects every person differently. Sometimes symptoms go away without treatment within a couple weeks. If treatment is needed, it varies from person to person. The goal of treatment is to reduce inflammation and protect the eye from damage. Treatment for Bell's palsy may include: Medicines, such as: Steroids to reduce swelling and inflammation. Antiviral medicines. Pain relievers, including aspirin, acetaminophen, or ibuprofen. Eye drops or ointment to keep your eye moist. Eye protection, if you cannot close your eye. Exercises or massage to regain muscle strength and function (physical therapy). Follow these instructions at home:  Take over-the-counter and prescription medicines only as told by your health care provider. If your eye is affected: Keep your eye moist with eye drops or ointment as told by your health care provider. Follow instructions for eye care and protection as told by your health care provider. Do any physical therapy exercises as told by your health care provider. Keep all follow-up visits. This is important. Contact a health care provider if: You have a fever or chills. Your symptoms do not get better within 2-3 weeks, or your symptoms get worse. Your eye is red, irritated, or painful. You have new symptoms. Get help right away if: You have weakness or numbness in a part of your body other than your face. You have trouble swallowing. You develop neck pain or stiffness. You develop dizziness or shortness of breath. Summary Bell's palsy  is a short-term inability to move muscles in a part of the face. The inability to move results from inflammation or compression of the facial nerve. This condition affects every person differently. Sometimes symptoms go away without treatment within a couple weeks. If treatment  is needed, it varies from person to person. The goal of treatment is to reduce inflammation and protect the eye from damage. Contact your health care provider if your symptoms do not get better within 2-3 weeks, or your symptoms get worse. This information is not intended to replace advice given to you by your health care provider. Make sure you discuss any questions you have with your health care provider. Document Revised: 10/14/2019 Document Reviewed: 10/14/2019 Elsevier Patient Education  2023 [employer].

## 2022-05-04 DIAGNOSIS — G510 Bell's palsy: Secondary | ICD-10-CM | POA: Insufficient documentation

## 2022-05-04 DIAGNOSIS — M542 Cervicalgia: Secondary | ICD-10-CM | POA: Insufficient documentation

## 2022-05-04 NOTE — Assessment & Plan Note (Signed)
Acute Continue prednisone and valtrex - complete full course Symptoms not worse -discussed expect symptoms to improve and completely resolve but it may take 2-3 weeks Start gabapentin 300 mg at night for neck pain - ? Shingles - neuritis Discussed possible side effects

## 2022-05-04 NOTE — Assessment & Plan Note (Signed)
Acute With Bels palsy  Continue prednisone and valtrex - complete full course Symptoms not worse -discussed expect symptoms to improve and completely resolve but it may take 2-3 weeks Start gabapentin 300 mg at night for neck pain - ? Shingles - neuritis Discussed possible side effects

## 2022-05-08 ENCOUNTER — Ambulatory Visit: Payer: BC Managed Care – PPO | Admitting: Internal Medicine

## 2022-05-13 ENCOUNTER — Ambulatory Visit: Payer: Self-pay | Admitting: General Surgery

## 2022-05-13 NOTE — H&P (Signed)
REFERRING PHYSICIAN:  Pincus Sanes, MD   PROVIDER:  Elenora Gamma, MD   MRN: [id number] DOB: 09-25-1980 DATE OF ENCOUNTER: 05/13/2022   Subjective    Chief Complaint: New Consultation (Chronic abscess of right buttock)       History of Present Illness: Stephen Freeman is a 42 y.o. male who is seen today as an office consultation at the request of Dr. Lawerance Bach for evaluation of New Consultation (Chronic abscess of right buttock) Patient presents to the office with approximately 4 months of drainage and swelling in his right buttock.  He states that he did have an abscess in the same location approximately 10 years ago.  It intermittently drained for several years and then stopped.  He has had not had any trouble with it since then.  He then developed an abscess in December, which has been draining since then.  He has used antibiotics on 2 different occasions with no success.  Patient denies any anorectal surgeries in the past.  He reports regular bowel habits with no chronic diarrhea.     Review of Systems: A complete review of systems was obtained from the patient.  I have reviewed this information and discussed as appropriate with the patient.  See HPI as well for other ROS.     Medical History: Past Medical History      Past Medical History:  Diagnosis Date   Anxiety           Problem List  There is no problem list on file for this patient.      Past Surgical History       Past Surgical History:  Procedure Laterality Date   EYELID SURGERY            Allergies  No Known Allergies     Medications Ordered Prior to Encounter  No current outpatient medications on file prior to visit.    No current facility-administered medications on file prior to visit.        Family History       Family History  Problem Relation Age of Onset   Diabetes Mother          Tobacco Use History  Social History       Tobacco Use  Smoking Status Never  Smokeless Tobacco  Never        Social History  Social History        Socioeconomic History   Marital status: Married  Tobacco Use   Smoking status: Never   Smokeless tobacco: Never  Substance and Sexual Activity   Alcohol use: Not Currently   Drug use: Never        Objective:         Vitals:    05/13/22 0909  BP: 133/85  Pulse: 85  Temp: 36.7 C (98 F)  SpO2: 97%  Weight: (!) 169.2 kg (373 lb)  Height: 180.3 cm (5\' 11" )      Exam Gen: NAD Abd: soft Rectal: Right lateral external opening, no palpable cord.  Fistula probe inserted goes deep into the subcutaneous tissue.     Labs, Imaging and Diagnostic Testing:     Assessment and Plan:  Diagnoses and all orders for this visit:   Anal fistula   42 year old male with a chronically draining perirectal sinus.  This is consistent with an anal abscess.  We discussed performing exam under anesthesia with fistulotomy, if the fistula tract is shallow or seton placement, if it is deeper and  involves the more than 20% of his sphincter complex.  We discussed the typical healing times for both procedures.  Risk include bleeding and a small risk of incontinence if fistulotomy was performed.  He is aware that further surgery will be needed if the seton is placed.   Vanita Panda, MD Colon and Rectal Surgery [facility]

## 2022-06-12 NOTE — Patient Instructions (Signed)
SURGICAL WAITING ROOM VISITATION  Patients having surgery or a procedure may have no more than 2 support people in the waiting area - these visitors may rotate.    Children under the age of 9 must have an adult with them who is not the patient.  Due to an increase in RSV and influenza rates and associated hospitalizations, children ages 16 and under may not visit patients in [facility] hospitals.  If the patient needs to stay at the hospital during part of their recovery, the visitor guidelines for inpatient rooms apply. Pre-op nurse will coordinate an appropriate time for 1 support person to accompany patient in pre-op.  This support person may not rotate.    Please refer to the [facility] website for the visitor guidelines for Inpatients (after your surgery is over and you are in a regular room).       Your procedure is scheduled on: 06/18/22   Report to [facility] Main Entrance    Report to admitting at 5:15 AM   Call this number if you have problems the morning of surgery [phone]   Do not eat food or drink liquids:After Midnight.            If you have questions, please contact your surgeon's office.   FOLLOW BOWEL PREP AND ANY ADDITIONAL PRE OP INSTRUCTIONS YOU RECEIVED FROM YOUR SURGEON'S OFFICE!!!     Oral Hygiene is also important to reduce your risk of infection.                                    Remember - BRUSH YOUR TEETH THE MORNING OF SURGERY WITH YOUR REGULAR TOOTHPASTE  DENTURES WILL BE REMOVED PRIOR TO SURGERY PLEASE DO NOT APPLY "Poly grip" OR ADHESIVES!!!   Do NOT smoke after Midnight   Take these medicines the morning of surgery with A SIP OF WATER: Gabapentin, Prednisone  DO NOT TAKE ANY ORAL DIABETIC MEDICATIONS DAY OF YOUR SURGERY  Bring CPAP mask and tubing day of surgery.                              You may not have any metal on your body including hair pins, jewelry, and body piercing             Do not wear make-up, lotions,  powders, cologne, or deodorant  Do not wear nail polish including gel and S&S, artificial/acrylic nails, or any other type of covering on natural nails including finger and toenails. If you have artificial nails, gel coating, etc. that needs to be removed by a nail salon please have this removed prior to surgery or surgery may need to be canceled/ delayed if the surgeon/ anesthesia feels like they are unable to be safely monitored.   Do not shave  48 hours prior to surgery.               Men may shave face and neck.   Do not bring valuables to the hospital. [facility] IS NOT             RESPONSIBLE   FOR VALUABLES.   Contacts, glasses, dentures or bridgework may not be worn into surgery.   Bring small overnight bag day of surgery.   DO NOT BRING YOUR HOME MEDICATIONS TO THE HOSPITAL. PHARMACY WILL DISPENSE MEDICATIONS LISTED ON YOUR MEDICATION LIST  TO YOU DURING YOUR ADMISSION IN THE HOSPITAL!    Patients discharged on the day of surgery will not be allowed to drive home.  Someone NEEDS to stay with you for the first 24 hours after anesthesia.   Special Instructions: Bring a copy of your healthcare power of attorney and living will documents the day of surgery if you haven't scanned them before.              Please read over the following fact sheets you were given: IF YOU HAVE QUESTIONS ABOUT YOUR PRE-OP INSTRUCTIONS PLEASE CALL [phone]   If you received a COVID test during your pre-op visit  it is requested that you wear a mask when out in public, stay away from anyone that may not be feeling well and notify your surgeon if you develop symptoms. If you test positive for Covid or have been in contact with anyone that has tested positive in the last 10 days please notify you surgeon.    [facility] - Preparing for Surgery Before surgery, you can play an important role.  Because skin is not sterile, your skin needs to be as free of germs as possible.  You can reduce the number of  germs on your skin by washing with CHG (chlorahexidine gluconate) soap before surgery.  CHG is an antiseptic cleaner which kills germs and bonds with the skin to continue killing germs even after washing. Please DO NOT use if you have an allergy to CHG or antibacterial soaps.  If your skin becomes reddened/irritated stop using the CHG and inform your nurse when you arrive at Short Stay. Do not shave (including legs and underarms) for at least 48 hours prior to the first CHG shower.  You may shave your face/neck.  Please follow these instructions carefully:  1.  Shower with CHG Soap the night before surgery and the  morning of surgery.  2.  If you choose to wash your hair, wash your hair first as usual with your normal  shampoo.  3.  After you shampoo, rinse your hair and body thoroughly to remove the shampoo.                             4.  Use CHG as you would any other liquid soap.  You can apply chg directly to the skin and wash.  Gently with a scrungie or clean washcloth.  5.  Apply the CHG Soap to your body ONLY FROM THE NECK DOWN.   Do   not use on face/ open                           Wound or open sores. Avoid contact with eyes, ears mouth and   genitals (private parts).                       Wash face,  Genitals (private parts) with your normal soap.             6.  Wash thoroughly, paying special attention to the area where your    surgery  will be performed.  7.  Thoroughly rinse your body with warm water from the neck down.  8.  DO NOT shower/wash with your normal soap after using and rinsing off the CHG Soap.                9.  Pat yourself dry with a clean towel.            10.  Wear clean pajamas.            11.  Place clean sheets on your bed the night of your first shower and do not  sleep with pets. Day of Surgery : Do not apply any lotions/deodorants the morning of surgery.  Please wear clean clothes to the hospital/surgery center.  FAILURE TO FOLLOW THESE INSTRUCTIONS MAY  RESULT IN THE CANCELLATION OF YOUR SURGERY  PATIENT SIGNATURE_________________________________  NURSE SIGNATURE__________________________________  ________________________________________________________________________

## 2022-06-12 NOTE — Progress Notes (Signed)
COVID Vaccine received:  [x]  No []  Yes Date of any COVID positive Test in last 90 days:  PCP - Cheryll Cockayne MD Cardiologist -   Chest x-ray - 09/15/15 EPIC EKG -  09/15/22 EPIC Stress Test -  ECHO -  Cardiac Cath -   Bowel Prep - []  No  []   Yes ______  Pacemaker / ICD device []  No []  Yes   Spinal Cord Stimulator:[]  No []  Yes       History of Sleep Apnea? []  No []  Yes   CPAP used?- []  No []  Yes    Does the patient monitor blood sugar?          []  No []  Yes  []  N/A  Patient has: []  NO Hx DM   []  Pre-DM                 []  DM1  []   DM2 Does patient have a [employer] or Dexacom? []  No []  Yes   Fasting Blood Sugar Ranges-  Checks Blood Sugar _____ times a day  GLP1 agonist / usual dose -  GLP1 instructions:  SGLT-2 inhibitors / usual dose -  SGLT-2 instructions:   Blood Thinner / Instructions: Aspirin Instructions:  Comments:   Activity level: Patient is able / unable to climb a flight of stairs without difficulty; []  No CP  []  No SOB, but would have ___   Patient can / can not perform ADLs without assistance.   Anesthesia review:   Patient denies shortness of breath, fever, cough and chest pain at PAT appointment.  Patient verbalized understanding and agreement to the Pre-Surgical Instructions that were given to them at this PAT appointment. Patient was also educated of the need to review these PAT instructions again prior to his/her surgery.I reviewed the appropriate phone numbers to call if they have any and questions or concerns.

## 2022-06-13 ENCOUNTER — Encounter (HOSPITAL_COMMUNITY)
Admission: RE | Admit: 2022-06-13 | Discharge: 2022-06-13 | Disposition: A | Discharge status: Home / Self Care | Payer: BC Managed Care – PPO | Source: Ambulatory Visit | Attending: General Surgery | Admitting: General Surgery

## 2022-06-13 ENCOUNTER — Other Ambulatory Visit: Payer: Self-pay

## 2022-06-13 ENCOUNTER — Encounter (HOSPITAL_COMMUNITY): Payer: Self-pay

## 2022-06-13 VITALS — BP 152/96 | HR 90 | Temp 98.8°F | Resp 20 | Ht 71.0 in | Wt 380.0 lb

## 2022-06-13 DIAGNOSIS — R7303 Prediabetes: Secondary | ICD-10-CM | POA: Diagnosis not present

## 2022-06-13 DIAGNOSIS — Z01812 Encounter for preprocedural laboratory examination: Secondary | ICD-10-CM | POA: Diagnosis present

## 2022-06-13 DIAGNOSIS — Z01818 Encounter for other preprocedural examination: Secondary | ICD-10-CM

## 2022-06-13 HISTORY — DX: Prediabetes: R73.03

## 2022-06-13 HISTORY — DX: Pneumonia, unspecified organism: J18.9

## 2022-06-13 HISTORY — DX: Anxiety disorder, unspecified: F41.9

## 2022-06-13 HISTORY — DX: Bell's palsy: G51.0

## 2022-06-13 LAB — CBC
HCT: 45.6 % (ref 39.0–52.0)
Hemoglobin: 14.7 g/dL (ref 13.0–17.0)
MCH: 28.7 pg (ref 26.0–34.0)
MCHC: 32.2 g/dL (ref 30.0–36.0)
MCV: 88.9 fL (ref 80.0–100.0)
Platelets: 267 10*3/uL (ref 150–400)
RBC: 5.13 MIL/uL (ref 4.22–5.81)
RDW: 13.2 % (ref 11.5–15.5)
WBC: 7.3 10*3/uL (ref 4.0–10.5)
nRBC: 0 % (ref 0.0–0.2)

## 2022-06-13 LAB — GLUCOSE, CAPILLARY: Glucose-Capillary: 100 mg/dL — ABNORMAL HIGH (ref 70–99)

## 2022-06-13 LAB — HEMOGLOBIN A1C
Hgb A1c MFr Bld: 5.9 % — ABNORMAL HIGH (ref 4.8–5.6)
Mean Plasma Glucose: 122.63 mg/dL

## 2022-06-17 NOTE — Anesthesia Preprocedure Evaluation (Signed)
Anesthesia Evaluation  Patient identified by MRN, date of birth, ID band Patient awake    Reviewed: Allergy & Precautions, NPO status , Patient's Chart, lab work & pertinent test results  History of Anesthesia Complications Negative for: history of anesthetic complications  Airway Mallampati: II  TM Distance: >3 FB Neck ROM: Full    Dental  (+) Dental Advisory Given, Poor Dentition   Pulmonary neg pulmonary ROS   breath sounds clear to auscultation       Cardiovascular negative cardio ROS  Rhythm:Regular Rate:Normal     Neuro/Psych  Headaches  Anxiety     Bell's palsy    GI/Hepatic Neg liver ROS,GERD  Medicated and Controlled,,  Endo/Other    Morbid obesityBMI 53  Renal/GU negative Renal ROS     Musculoskeletal  (+) Arthritis ,    Abdominal  (+) + obese  Peds  Hematology negative hematology ROS (+)   Anesthesia Other Findings   Reproductive/Obstetrics                              Anesthesia Physical Anesthesia Plan  ASA: 3  Anesthesia Plan: General   Post-op Pain Management: Tylenol PO (pre-op)*   Induction: Intravenous  PONV Risk Score and Plan: 2 and Ondansetron and Dexamethasone  Airway Management Planned: Oral ETT  Additional Equipment: None  Intra-op Plan:   Post-operative Plan: Extubation in OR  Informed Consent: I have reviewed the patients History and Physical, chart, labs and discussed the procedure including the risks, benefits and alternatives for the proposed anesthesia with the patient or authorized representative who has indicated his/her understanding and acceptance.     Dental advisory given  Plan Discussed with: CRNA and Surgeon  Anesthesia Plan Comments:          Anesthesia Quick Evaluation

## 2022-06-18 ENCOUNTER — Encounter (HOSPITAL_COMMUNITY): Payer: Self-pay | Admitting: General Surgery

## 2022-06-18 ENCOUNTER — Ambulatory Visit (HOSPITAL_COMMUNITY)
Admission: RE | Admit: 2022-06-18 | Discharge: 2022-06-18 | Disposition: A | Discharge status: Home / Self Care | Payer: BC Managed Care – PPO | Attending: General Surgery | Admitting: General Surgery

## 2022-06-18 ENCOUNTER — Other Ambulatory Visit: Payer: Self-pay

## 2022-06-18 ENCOUNTER — Ambulatory Visit (HOSPITAL_COMMUNITY): Payer: BC Managed Care – PPO | Admitting: Certified Registered Nurse Anesthetist

## 2022-06-18 ENCOUNTER — Encounter (HOSPITAL_COMMUNITY)
Admission: RE | Disposition: A | Discharge status: Home / Self Care | Payer: Self-pay | Source: Home / Self Care | Attending: General Surgery

## 2022-06-18 DIAGNOSIS — M1990 Unspecified osteoarthritis, unspecified site: Secondary | ICD-10-CM | POA: Insufficient documentation

## 2022-06-18 DIAGNOSIS — F419 Anxiety disorder, unspecified: Secondary | ICD-10-CM | POA: Diagnosis not present

## 2022-06-18 DIAGNOSIS — R7303 Prediabetes: Secondary | ICD-10-CM

## 2022-06-18 DIAGNOSIS — K219 Gastro-esophageal reflux disease without esophagitis: Secondary | ICD-10-CM | POA: Diagnosis not present

## 2022-06-18 DIAGNOSIS — E669 Obesity, unspecified: Secondary | ICD-10-CM | POA: Diagnosis not present

## 2022-06-18 DIAGNOSIS — G510 Bell's palsy: Secondary | ICD-10-CM | POA: Diagnosis not present

## 2022-06-18 DIAGNOSIS — K603 Anal fistula: Secondary | ICD-10-CM | POA: Diagnosis not present

## 2022-06-18 DIAGNOSIS — R519 Headache, unspecified: Secondary | ICD-10-CM | POA: Diagnosis not present

## 2022-06-18 DIAGNOSIS — Z6841 Body Mass Index (BMI) 40.0 and over, adult: Secondary | ICD-10-CM | POA: Diagnosis not present

## 2022-06-18 DIAGNOSIS — Z01818 Encounter for other preprocedural examination: Secondary | ICD-10-CM

## 2022-06-18 HISTORY — PX: ANAL FISTULOTOMY: SHX6423

## 2022-06-18 HISTORY — PX: RECTAL EXAM UNDER ANESTHESIA: SHX6399

## 2022-06-18 SURGERY — EXAM UNDER ANESTHESIA, RECTUM
Anesthesia: General

## 2022-06-18 MED ORDER — OXYCODONE HCL 5 MG/5ML PO SOLN: 5.0000 mg | Freq: Once | ORAL | Status: DC | PRN

## 2022-06-18 MED ORDER — MIDAZOLAM HCL 2 MG/2ML IJ SOLN: 0.5000 mg | Freq: Once | INTRAMUSCULAR | Status: DC | PRN

## 2022-06-18 MED ORDER — PROMETHAZINE HCL 25 MG/ML IJ SOLN: 6.2500 mg | INTRAMUSCULAR | Status: DC | PRN

## 2022-06-18 MED ORDER — MIDAZOLAM HCL 5 MG/5ML IJ SOLN
INTRAMUSCULAR | Status: DC | PRN
  Administered 2022-06-18: 2 mg via INTRAVENOUS

## 2022-06-18 MED ORDER — DEXAMETHASONE SODIUM PHOSPHATE 10 MG/ML IJ SOLN
INTRAMUSCULAR | Status: AC
  Filled 2022-06-18: qty 1

## 2022-06-18 MED ORDER — ROCURONIUM BROMIDE 10 MG/ML (PF) SYRINGE
PREFILLED_SYRINGE | INTRAVENOUS | Status: AC
  Filled 2022-06-18: qty 10

## 2022-06-18 MED ORDER — LIDOCAINE HCL (PF) 2 % IJ SOLN
INTRAMUSCULAR | Status: AC
  Filled 2022-06-18: qty 5

## 2022-06-18 MED ORDER — PROPOFOL 10 MG/ML IV BOLUS
INTRAVENOUS | Status: AC
  Filled 2022-06-18: qty 20

## 2022-06-18 MED ORDER — ORAL CARE MOUTH RINSE: 15.0000 mL | Freq: Once | OROMUCOSAL | Status: AC

## 2022-06-18 MED ORDER — CHLORHEXIDINE GLUCONATE 0.12 % MT SOLN
15.0000 mL | Freq: Once | OROMUCOSAL | Status: AC
  Administered 2022-06-18: 15 mL via OROMUCOSAL

## 2022-06-18 MED ORDER — ROCURONIUM BROMIDE 50 MG/5ML IV SOSY
PREFILLED_SYRINGE | INTRAVENOUS | Status: DC | PRN
  Administered 2022-06-18: 70 mg via INTRAVENOUS

## 2022-06-18 MED ORDER — SODIUM CHLORIDE 0.9% FLUSH: 3.0000 mL | Freq: Two times a day (BID) | INTRAVENOUS | Status: DC

## 2022-06-18 MED ORDER — HYDROMORPHONE HCL 1 MG/ML IJ SOLN: 0.2500 mg | INTRAMUSCULAR | Status: DC | PRN

## 2022-06-18 MED ORDER — BUPIVACAINE-EPINEPHRINE 0.25% -1:200000 IJ SOLN
INTRAMUSCULAR | Status: DC | PRN
  Administered 2022-06-18: 40 mL

## 2022-06-18 MED ORDER — DEXAMETHASONE SODIUM PHOSPHATE 10 MG/ML IJ SOLN
INTRAMUSCULAR | Status: DC | PRN
  Administered 2022-06-18: 4 mg via INTRAVENOUS

## 2022-06-18 MED ORDER — ONDANSETRON HCL 4 MG/2ML IJ SOLN
INTRAMUSCULAR | Status: AC
  Filled 2022-06-18: qty 2

## 2022-06-18 MED ORDER — HYDROGEN PEROXIDE 3 % EX SOLN
CUTANEOUS | Status: AC
  Filled 2022-06-18: qty 473

## 2022-06-18 MED ORDER — OXYCODONE HCL 5 MG PO TABS: 5.0000 mg | ORAL_TABLET | Freq: Four times a day (QID) | ORAL | 0 refills | Status: AC | PRN

## 2022-06-18 MED ORDER — PROPOFOL 10 MG/ML IV BOLUS
INTRAVENOUS | Status: DC | PRN
  Administered 2022-06-18: 200 mg via INTRAVENOUS

## 2022-06-18 MED ORDER — LIDOCAINE 2% (20 MG/ML) 5 ML SYRINGE
INTRAMUSCULAR | Status: DC | PRN
  Administered 2022-06-18: 40 mg via INTRAVENOUS

## 2022-06-18 MED ORDER — SUGAMMADEX SODIUM 200 MG/2ML IV SOLN
INTRAVENOUS | Status: DC | PRN
  Administered 2022-06-18: 400 mg via INTRAVENOUS

## 2022-06-18 MED ORDER — ACETAMINOPHEN 500 MG PO TABS: 1000.0000 mg | ORAL_TABLET | Freq: Once | ORAL | Status: DC

## 2022-06-18 MED ORDER — FENTANYL CITRATE (PF) 250 MCG/5ML IJ SOLN
INTRAMUSCULAR | Status: DC | PRN
  Administered 2022-06-18 (×2): 100 ug via INTRAVENOUS
  Administered 2022-06-18: 50 ug via INTRAVENOUS

## 2022-06-18 MED ORDER — LACTATED RINGERS IV SOLN: INTRAVENOUS | Status: DC

## 2022-06-18 MED ORDER — 0.9 % SODIUM CHLORIDE (POUR BTL) OPTIME
TOPICAL | Status: DC | PRN
  Administered 2022-06-18: 1000 mL

## 2022-06-18 MED ORDER — FENTANYL CITRATE (PF) 250 MCG/5ML IJ SOLN
INTRAMUSCULAR | Status: AC
  Filled 2022-06-18: qty 5

## 2022-06-18 MED ORDER — OXYCODONE HCL 5 MG PO TABS: 5.0000 mg | ORAL_TABLET | Freq: Once | ORAL | Status: DC | PRN

## 2022-06-18 MED ORDER — BUPIVACAINE HCL 0.25 % IJ SOLN
INTRAMUSCULAR | Status: AC
  Filled 2022-06-18: qty 1

## 2022-06-18 MED ORDER — ACETAMINOPHEN 500 MG PO TABS
1000.0000 mg | ORAL_TABLET | ORAL | Status: AC
  Administered 2022-06-18: 1000 mg via ORAL
  Filled 2022-06-18: qty 2

## 2022-06-18 MED ORDER — BUPIVACAINE LIPOSOME 1.3 % IJ SUSP
INTRAMUSCULAR | Status: AC
  Filled 2022-06-18: qty 20

## 2022-06-18 MED ORDER — ONDANSETRON HCL 4 MG/2ML IJ SOLN
INTRAMUSCULAR | Status: DC | PRN
  Administered 2022-06-18: 4 mg via INTRAVENOUS

## 2022-06-18 MED ORDER — MIDAZOLAM HCL 2 MG/2ML IJ SOLN
INTRAMUSCULAR | Status: AC
  Filled 2022-06-18: qty 2

## 2022-06-18 MED ORDER — MEPERIDINE HCL 50 MG/ML IJ SOLN: 6.2500 mg | INTRAMUSCULAR | Status: DC | PRN

## 2022-06-18 SURGICAL SUPPLY — 45 items
APL SKNCLS STERI-STRIP NONHPOA (GAUZE/BANDAGES/DRESSINGS) ×1
BAG COUNTER SPONGE SURGICOUNT (BAG) IMPLANT
BAG SPNG CNTER NS LX DISP (BAG)
BENZOIN TINCTURE PRP APPL 2/3 (GAUZE/BANDAGES/DRESSINGS) ×1 IMPLANT
BLADE SURG 15 STRL LF DISP TIS (BLADE) IMPLANT
BLADE SURG 15 STRL SS (BLADE)
BLADE SURG SZ10 CARB STEEL (BLADE) ×1 IMPLANT
BRIEF MESH DISP LRG (UNDERPADS AND DIAPERS) ×2 IMPLANT
COVER SURGICAL LIGHT HANDLE (MISCELLANEOUS) ×1 IMPLANT
DRAPE UTILITY XL STRL (DRAPES) ×1 IMPLANT
DRSG VASELINE 3X18 (GAUZE/BANDAGES/DRESSINGS) IMPLANT
ELECT REM PT RETURN 15FT ADLT (MISCELLANEOUS) ×1 IMPLANT
GAUZE 4X4 16PLY ~~LOC~~+RFID DBL (SPONGE) IMPLANT
GAUZE PAD ABD 8X10 STRL (GAUZE/BANDAGES/DRESSINGS) IMPLANT
GAUZE SPONGE 4X4 12PLY STRL (GAUZE/BANDAGES/DRESSINGS) IMPLANT
GLOVE BIO SURGEON STRL SZ 6.5 (GLOVE) ×1 IMPLANT
GLOVE BIOGEL PI IND STRL 7.0 (GLOVE) ×1 IMPLANT
GLOVE INDICATOR 6.5 STRL GRN (GLOVE) ×1 IMPLANT
GOWN STRL REUS W/ TWL XL LVL3 (GOWN DISPOSABLE) ×2 IMPLANT
GOWN STRL REUS W/TWL XL LVL3 (GOWN DISPOSABLE) ×2
IV CATH 18GX1.25 SAFE RETR GRN (IV SOLUTION) IMPLANT
KIT BASIN OR (CUSTOM PROCEDURE TRAY) ×1 IMPLANT
KIT TURNOVER KIT A (KITS) IMPLANT
LEGGING LITHOTOMY PAIR STRL (DRAPES) IMPLANT
LOOP VESSEL MAXI BLUE (MISCELLANEOUS) IMPLANT
NDL HYPO 25X1 1.5 SAFETY (NEEDLE) ×1 IMPLANT
NDL SAFETY ECLIP 18X1.5 (MISCELLANEOUS) IMPLANT
NEEDLE HYPO 25X1 1.5 SAFETY (NEEDLE) ×1 IMPLANT
PACK LITHOTOMY IV (CUSTOM PROCEDURE TRAY) ×1 IMPLANT
PENCIL SMOKE EVACUATOR (MISCELLANEOUS) IMPLANT
SPIKE FLUID TRANSFER (MISCELLANEOUS) ×1 IMPLANT
SPONGE SURGIFOAM ABS GEL 12-7 (HEMOSTASIS) IMPLANT
SUT CHROMIC 2 0 SH (SUTURE) IMPLANT
SUT ETHIBOND 0 (SUTURE) IMPLANT
SUT GUT CHROMIC 3 0 (SUTURE) IMPLANT
SUT MON AB 3-0 SH 27 (SUTURE) ×1
SUT MON AB 3-0 SH27 (SUTURE) ×1 IMPLANT
SUT VIC AB 3-0 SH 27 (SUTURE) ×1
SUT VIC AB 3-0 SH 27XBRD (SUTURE) IMPLANT
SUT VIC AB 4-0 P-3 18XBRD (SUTURE) IMPLANT
SUT VIC AB 4-0 P3 18 (SUTURE)
SYR 20ML LL LF (SYRINGE) IMPLANT
SYR CONTROL 10ML LL (SYRINGE) ×1 IMPLANT
TOWEL OR 17X26 10 PK STRL BLUE (TOWEL DISPOSABLE) ×1 IMPLANT
TOWEL OR NON WOVEN STRL DISP B (DISPOSABLE) ×1 IMPLANT

## 2022-06-18 NOTE — Discharge Instructions (Signed)

## 2022-06-18 NOTE — Transfer of Care (Signed)
Immediate Anesthesia Transfer of Care Note  Patient: Stephen Freeman  Procedure(s) Performed: ANAL EXAM UNDER ANESTHESIA FISTULOTOMY  Patient Location: PACU  Anesthesia Type:General  Level of Consciousness: drowsy and patient cooperative  Airway & Oxygen Therapy: Patient Spontanous Breathing and Patient connected to nasal cannula oxygen  Post-op Assessment: Report given to RN and Post -op Vital signs reviewed and stable  Post vital signs: Reviewed and stable  Last Vitals:  Vitals Value Taken Time  BP 156/105 06/18/22 0834  Temp 36.5 C 06/18/22 0833  Pulse 95 06/18/22 0836  Resp 13 06/18/22 0836  SpO2 98 % 06/18/22 0836  Vitals shown include unvalidated device data.  Last Pain:  Vitals:   06/18/22 0619  TempSrc:   PainSc: 0-No pain         Complications: No notable events documented.

## 2022-06-18 NOTE — Progress Notes (Signed)
Page to Dr. Maisie Fus re: ABX

## 2022-06-18 NOTE — Anesthesia Postprocedure Evaluation (Signed)
Anesthesia Post Note  Patient: Stephen Freeman  Procedure(s) Performed: ANAL EXAM UNDER ANESTHESIA FISTULOTOMY     Patient location during evaluation: PACU Anesthesia Type: General Level of consciousness: awake and alert, patient cooperative and oriented Pain management: pain level controlled Vital Signs Assessment: post-procedure vital signs reviewed and stable Respiratory status: spontaneous breathing, nonlabored ventilation and respiratory function stable Cardiovascular status: blood pressure returned to baseline and stable Postop Assessment: no apparent nausea or vomiting Anesthetic complications: no   No notable events documented.  Last Vitals:  Vitals:   06/18/22 0907 06/18/22 0917  BP:  (!) 153/110  Pulse:  81  Resp: 15 16  Temp:  36.5 C  SpO2: 97% 98%    Last Pain:  Vitals:   06/18/22 0917  TempSrc: Axillary  PainSc: 0-No pain                 Orchid Glassberg,E. V[place] Stare

## 2022-06-18 NOTE — H&P (Signed)
REFERRING PHYSICIAN:  Pincus Sanes, MD   PROVIDER:  Elenora Gamma, MD   DOB: November 05, 1980    Subjective      History of Present Illness: Stephen Freeman is a 42 y.o. male who is seen today as an office consultation at the request of Dr. Lawerance Bach for evaluation of New Consultation (Chronic abscess of right buttock) Patient presents to the office with approximately 4 months of drainage and swelling in his right buttock.  He states that he did have an abscess in the same location approximately 10 years ago.  It intermittently drained for several years and then stopped.  He has had not had any trouble with it since then.  He then developed an abscess in December, which has been draining since then.  He has used antibiotics on 2 different occasions with no success.  Patient denies any anorectal surgeries in the past.  He reports regular bowel habits with no chronic diarrhea.     Review of Systems: A complete review of systems was obtained from the patient.  I have reviewed this information and discussed as appropriate with the patient.  See HPI as well for other ROS.     Medical History: Past Medical History         Past Medical History:  Diagnosis Date   Anxiety            Problem List  There is no problem list on file for this patient.      Past Surgical History           Past Surgical History:  Procedure Laterality Date   EYELID SURGERY            Allergies  No Known Allergies      Medications Ordered Prior to Encounter  No current outpatient medications on file prior to visit.    No current facility-administered medications on file prior to visit.        Family History           Family History  Problem Relation Age of Onset   Diabetes Mother          Tobacco Use History  Social History         Tobacco Use  Smoking Status Never  Smokeless Tobacco Never        Social History  Social History           Socioeconomic History   Marital  status: Married  Tobacco Use   Smoking status: Never   Smokeless tobacco: Never  Substance and Sexual Activity   Alcohol use: Not Currently   Drug use: Never        Objective:      Vitals:   06/18/22 0556  BP: (!) 139/93  Pulse: 90  Resp: 20  Temp: 98.9 F (37.2 C)  SpO2: 96%      Exam Gen: NAD CV: RRR Lungs: CTA Abd: soft Rectal: Right lateral external opening, no palpable cord.  Fistula probe inserted goes deep into the subcutaneous tissue.     Labs, Imaging and Diagnostic Testing:     Assessment and Plan:  Diagnoses and all orders for this visit:   Anal fistula   43 year old male with a chronically draining perirectal sinus.  This is consistent with an anal abscess.  We discussed performing exam under anesthesia with fistulotomy, if the fistula tract is shallow or seton placement, if it is deeper and involves the more than 20% of his  sphincter complex.  We discussed the typical healing times for both procedures.  Risk include bleeding and a small risk of incontinence if fistulotomy was performed.  He is aware that further surgery will be needed if the seton is placed.   Vanita Panda, MD Colon and Rectal Surgery [facility]

## 2022-06-18 NOTE — Op Note (Signed)
06/18/2022  8:17 AM  PATIENT:  Stephen Freeman  42 y.o. male  Patient Care Team: Pincus Sanes, MD as PCP - General (Internal Medicine)  PRE-OPERATIVE DIAGNOSIS:  ANAL FISTULA  POST-OPERATIVE DIAGNOSIS:  ANAL FISTULA  PROCEDURE:  ANAL EXAM UNDER ANESTHESIA with FISTULOTOMY    Surgeon(s): Romie Levee, MD  ASSISTANT: none   ANESTHESIA:   local and general  SPECIMEN:  No Specimen  DISPOSITION OF SPECIMEN:  N/A  COUNTS:  YES  PLAN OF CARE: Discharge to home after PACU  PATIENT DISPOSITION:  PACU - hemodynamically stable.  INDICATION: 42 y.o. M with anal fistula   OR FINDINGS: R lateral trans-sphincteric fistula involving superficial external sphincter fibers only.    DESCRIPTION: the patient was identified in the preoperative holding area and taken to the OR where they were laid on the operating room table.  General anesthesia was induced without difficulty. The patient was then positioned in prone jackknife position with buttocks gently taped apart.  The patient was then prepped and draped in usual sterile fashion.  SCDs were noted to be in place prior to the initiation of anesthesia. A surgical timeout was performed indicating the correct patient, procedure, positioning and need for preoperative antibiotics.  A rectal block was performed using Marcaine.    I began with a digital rectal exam.  There were no masses noted.  I then placed a Hill-Ferguson anoscope into the anal canal and evaluated this completely.  There was an internal opening well distal to the dentate line.  I used an S shaped fistula probe to find the tract.  Given that no internal sphincter was involved, I did a fistulotomy using cautery.  The external sphincter fibers were then tacked to the fistula tract using interrrupted 3-0 Vicryl sutures.  The tract was then marsupialized using 2-0 running Chromic sutures.  Additional marcaine was infused around the site and a dressing was applied.  Hemostasis was good.   The patient was awakened from anesthesia and sent to the PACU instable condition.  All counts were correct.  Vanita Panda, MD  Colorectal and General Surgery [facility]

## 2022-06-18 NOTE — Anesthesia Procedure Notes (Signed)
Procedure Name: Intubation Date/Time: 06/18/2022 7:32 AM  Performed by: Adria Dill, CRNAPre-anesthesia Checklist: Patient identified, Emergency Drugs available, Suction available and Patient being monitored Patient Re-evaluated:Patient Re-evaluated prior to induction Oxygen Delivery Method: Circle system utilized Preoxygenation: Pre-oxygenation with 100% oxygen Induction Type: IV induction Ventilation: Mask ventilation without difficulty Laryngoscope Size: Glidescope and 4 Grade View: Grade I Tube type: Oral Tube size: 7.5 mm Number of attempts: 1 Airway Equipment and Method: Stylet and Oral airway Placement Confirmation: ETT inserted through vocal cords under direct vision, positive ETCO2 and breath sounds checked- equal and bilateral Secured at: 22 cm Tube secured with: Tape Dental Injury: Teeth and Oropharynx as per pre-operative assessment

## 2022-06-19 ENCOUNTER — Encounter (HOSPITAL_COMMUNITY): Payer: Self-pay | Admitting: General Surgery
